# Patient Record
Sex: Female | Born: 1990
Health system: Southern US, Community
[De-identification: ages and names within clinical notes are randomized; demographics above are authoritative.]

## PROBLEM LIST (undated history)

## (undated) DIAGNOSIS — Z789 Other specified health status: Secondary | ICD-10-CM

## (undated) DIAGNOSIS — B009 Herpesviral infection, unspecified: Secondary | ICD-10-CM

## (undated) DIAGNOSIS — F633 Trichotillomania: Secondary | ICD-10-CM

## (undated) HISTORY — PX: OTHER SURGICAL HISTORY: SHX169

## (undated) HISTORY — DX: Trichotillomania: F63.3

## (undated) HISTORY — DX: Herpesviral infection, unspecified: B00.9

## (undated) HISTORY — PX: BREAST ENHANCEMENT SURGERY: SHX7

---
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
## 2010-07-20 ENCOUNTER — Ambulatory Visit (HOSPITAL_COMMUNITY): Payer: Self-pay | Admitting: Psychiatry

## 2010-08-04 ENCOUNTER — Ambulatory Visit (INDEPENDENT_AMBULATORY_CARE_PROVIDER_SITE_OTHER): Payer: 59 | Admitting: Psychiatry

## 2010-08-04 DIAGNOSIS — F919 Conduct disorder, unspecified: Secondary | ICD-10-CM

## 2010-08-04 DIAGNOSIS — F411 Generalized anxiety disorder: Secondary | ICD-10-CM

## 2010-08-12 ENCOUNTER — Encounter (INDEPENDENT_AMBULATORY_CARE_PROVIDER_SITE_OTHER): Payer: 59 | Admitting: Psychiatry

## 2010-08-12 DIAGNOSIS — F919 Conduct disorder, unspecified: Secondary | ICD-10-CM

## 2010-08-12 DIAGNOSIS — F411 Generalized anxiety disorder: Secondary | ICD-10-CM

## 2010-08-26 ENCOUNTER — Encounter (INDEPENDENT_AMBULATORY_CARE_PROVIDER_SITE_OTHER): Payer: 59 | Admitting: Psychiatry

## 2010-08-26 DIAGNOSIS — F411 Generalized anxiety disorder: Secondary | ICD-10-CM

## 2010-08-26 DIAGNOSIS — F919 Conduct disorder, unspecified: Secondary | ICD-10-CM

## 2010-09-01 ENCOUNTER — Ambulatory Visit (INDEPENDENT_AMBULATORY_CARE_PROVIDER_SITE_OTHER): Payer: 59 | Admitting: Psychiatry

## 2010-09-01 DIAGNOSIS — F429 Obsessive-compulsive disorder, unspecified: Secondary | ICD-10-CM

## 2010-09-02 ENCOUNTER — Ambulatory Visit (HOSPITAL_COMMUNITY): Payer: 59 | Admitting: Psychology

## 2010-09-02 NOTE — Progress Notes (Signed)
Jacqueline Mendez, GAME              ACCOUNT NO.:  [id number]  MEDICAL RECORD NO.:  [id number]  LOCATION:  [facility]                           FACILITY:  [facility]  PHYSICIAN:  Brantley Naser T. Donell Tomkins, M.D.   DATE OF BIRTH:  February 21, 1990                                PROGRESS NOTE Date fo Service;09/01/10  The patient is a 20 year old Caucasian employed female who is referred from her primary care doctor, Dr. [name] and our therapist, [name], for seeking treatment.  The patient has been seen by our therapist few times.  She has been diagnosed with anxiety disorder and trichotillomania.  The patient endorsed that she has anxiety symptoms since she was in college.  She endorsed increased anxiety, worrying, incriminating thoughts including pulling her hair from her eyebrow.  She told that lately these symptoms are getting worse and she needed to get some treatment.  In the past she had tried Zoloft, Celexa and Paxil, but last year she stopped the medication, has felt she was doing better but the symptoms restarted again.  She is back on Paxil now 20 mg daily with the Klonopin 0.5 mg at bedtime.  Patient told that Klonopin helps but it makes very dizzy and sleepy and she only takes if she needed.  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  The patient is worried about her future, career,  life goal and wants to be accomplished in her life. The patient admitted that she has sometimes rumination and rituals in her head when she worried about leaving electrical items unplugged.  She also complained of fear with a height and obsessed about things need to be in order.  Patient told that her boyfriend believes that I am very anxious and nervous person.  She admitted sleeping too much when she takes Klonopin, but she denies any agitation, anger, mood swings or paranoid thinking.  She reported hearing songs when she is under stress and these  songs are sometimes comforting when she is in a good mood and disturbing when she is in a bad mood.  She denies any auditory hallucinations or visual hallucinations.  She reported that Paxil and Klonopin had helped her but she is more anxious about taking exam which she failed last time.  She denies any suicidal thoughts or homicidal thoughts at this time.  PAST PSYCHIATRIC HISTORY: As mentioned above, the patient has been tried with Celexa and Zoloft in the past.  She denies any previous history of inpatient treatment or previous history of suicidal attempt.  There has been no history of violence or anger or  agitation.  FAMILY HISTORY: The patient admitted that one of her cousins has psychiatric illness and her dad has depression who takes medication.  MEDICAL ILLNESS: The patient has seasonal allergies.  She also takes birth control pill.  PSYCHOSOCIAL HISTORY: The patient is born and raised in [place] area.  She has a loving family.  Her fiance is very supportive.  She is working as a Lawyer and likes her job.  EDUCATION/HISTORY: The patient has high school education and currently enrolled in [employer] for nursing  school.  ALCOHOL AND SUBSTANCE ABUSE HISTORY: The patient denies any history of any illegal substance.  She admitted drinking occasionally wine, but no history of tremors, blackouts, withdrawals or intoxication.  CURRENT MEDICATIONS: 1. Paxil 20 mg daily. 2. Klonopin 0.5 mg p.r.n. as needed. 3. Birth control pills.  MENTAL STATUS EXAM: The patient is a young female who is well-groomed, well-dressed, appears appropriate with her age.  She is cooperative, pleasant and maintaining good eye contact.  Her speech is relevant, soft and coherent.  Her thought process was logical, linear and goal-directed.  She described her mood as anxious and affect was mood congruent.  She denies any auditory hallucinations, suicidal thoughts or homicidal thoughts.  There was  no  psychosis present.  She is alert and oriented x3.  Her attention and concentration were good.  Her insight, judgment, impulse control were okay.  DIAGNOSIS: AXIS I:  Anxiety disorder NOS, rule out obsessive disorder, rule out obsessive-compulsive disorder.  Trichotillomania. AXIS II:  Deferred. AXIS III:  Seasonal allergies. AXIS IV:  Mild to moderate. AXIS V:  65-75.  PLAN: At this time I do believe the patient is doing better on Paxil 20 mg though she has complained some increased sleep with the Klonopin.  I recommended to take only half as needed.  Patient experiencing no side effects of medication at this time, however, she is very worried about her illness course and prognosis.  We spent time about the risks and benefits of medication along with the prognosis and comorbidity of her psychiatric illness.  We also talked about getting psychological testing to further strengthen the diagnosis.  Patient has agreed to that.  We will schedule appointment with Dr. Kieth Brightly for psychological testing. She will continue to work with [name] to increase her coping and social skills and to reduce her anxiety and symptoms without drug therapy.  We will consider changing the antidepressant if the patient does not feel improvement or starting to get worse on her current medication.  I will see her again in 5 weeks.     [name] T. Lolly Mustache, M.D.     STA/MEDQ  D:  09/01/2010  T:  09/01/2010  Job:  [id number]  Electronically Signed by Kathryne Sharper M.D. on 09/02/2010 01:16:39 PM

## 2010-09-03 ENCOUNTER — Encounter (HOSPITAL_COMMUNITY): Payer: 59 | Admitting: Psychiatry

## 2010-09-08 ENCOUNTER — Encounter (HOSPITAL_COMMUNITY): Payer: 59 | Admitting: Psychiatry

## 2010-09-15 ENCOUNTER — Ambulatory Visit (HOSPITAL_COMMUNITY): Payer: 59 | Admitting: Psychiatry

## 2010-10-06 ENCOUNTER — Encounter (HOSPITAL_COMMUNITY): Payer: 59 | Admitting: Psychiatry

## 2010-10-15 LAB — RAPID STREP SCREEN (MED CTR MEBANE ONLY): Streptococcus, Group A Screen (Direct): NEGATIVE

## 2010-10-15 LAB — STREP A DNA PROBE

## 2011-01-09 ENCOUNTER — Other Ambulatory Visit (HOSPITAL_COMMUNITY): Payer: Self-pay | Admitting: Psychiatry

## 2012-08-30 ENCOUNTER — Encounter: Payer: Self-pay | Admitting: *Deleted

## 2012-09-06 ENCOUNTER — Ambulatory Visit (INDEPENDENT_AMBULATORY_CARE_PROVIDER_SITE_OTHER): Payer: 59 | Admitting: Family Medicine

## 2012-09-06 ENCOUNTER — Encounter: Payer: Self-pay | Admitting: Family Medicine

## 2012-09-06 ENCOUNTER — Telehealth: Payer: Self-pay | Admitting: Family Medicine

## 2012-09-06 VITALS — BP 110/68 | Ht 64.0 in | Wt 139.2 lb

## 2012-09-06 DIAGNOSIS — Z0000 Encounter for general adult medical examination without abnormal findings: Secondary | ICD-10-CM

## 2012-09-06 DIAGNOSIS — F429 Obsessive-compulsive disorder, unspecified: Secondary | ICD-10-CM

## 2012-09-06 DIAGNOSIS — M549 Dorsalgia, unspecified: Secondary | ICD-10-CM

## 2012-09-06 NOTE — Progress Notes (Signed)
  Subjective:    Patient ID: Ricka Burdock, female    DOB: 12/07/1990, 22 y.o.   MRN: [id number]  HPI  Reg exercise, goes daily to a gym  Watching her diet closely  No new med problems Some recent low bk pain with exercise and exertion, mild  To mod ache no noct sympt   Review of Systems No chest pain no back pain other than noted no abdominal pain no change in bowel habits ROS otherwise negative    Objective:   Physical Exam  Alert no acute distress. HEENT normal. Lungs clear. Heart regular rate and rhythm. Low back slight discomfort to palpation      Assessment & Plan:  Impression 1 lumbar strain. #2 vaccines clarified for school purposes form filled out. Plan symptomatic care discussed.

## 2012-09-06 NOTE — Telephone Encounter (Signed)
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

## 2012-09-07 DIAGNOSIS — F429 Obsessive-compulsive disorder, unspecified: Secondary | ICD-10-CM | POA: Insufficient documentation

## 2012-09-08 LAB — TB SKIN TEST: TB Skin Test: NEGATIVE

## 2012-11-13 ENCOUNTER — Encounter: Payer: Self-pay | Admitting: Nurse Practitioner

## 2012-11-13 ENCOUNTER — Ambulatory Visit (INDEPENDENT_AMBULATORY_CARE_PROVIDER_SITE_OTHER): Payer: 59 | Admitting: Nurse Practitioner

## 2012-11-13 VITALS — BP 110/64 | Temp 99.8°F | Ht 64.0 in | Wt 145.0 lb

## 2012-11-13 DIAGNOSIS — Z789 Other specified health status: Secondary | ICD-10-CM

## 2012-11-13 DIAGNOSIS — N644 Mastodynia: Secondary | ICD-10-CM

## 2012-11-13 DIAGNOSIS — F419 Anxiety disorder, unspecified: Secondary | ICD-10-CM

## 2012-11-13 MED ORDER — NAPROXEN 500 MG PO TABS: 500.0000 mg | ORAL_TABLET | Freq: Two times a day (BID) | ORAL | Status: DC

## 2012-11-13 MED ORDER — CEPHALEXIN 500 MG PO CAPS: 500.0000 mg | ORAL_CAPSULE | Freq: Three times a day (TID) | ORAL | Status: DC

## 2012-11-13 MED ORDER — ALPRAZOLAM 0.5 MG PO TABS: ORAL_TABLET | ORAL | Status: DC

## 2012-11-14 ENCOUNTER — Telehealth: Payer: Self-pay | Admitting: *Deleted

## 2012-11-14 NOTE — Telephone Encounter (Signed)
Area of tenderness is at areola and mainly in the breast 6-7 o'clock just beneath areola.

## 2012-11-14 NOTE — Telephone Encounter (Signed)
[facility] needs to know exact location of pain and swelling in right breast before they will schedule ultrasound. Scheduler stated they do not scan the whole breast only the area with the issue. [facility] # is [id number]. Pt can go any time after 9am on thurs. Order is already put in system. Need to call and schedule after we have exact area on breast.

## 2012-11-15 ENCOUNTER — Encounter: Payer: Self-pay | Admitting: Nurse Practitioner

## 2012-11-15 DIAGNOSIS — N644 Mastodynia: Secondary | ICD-10-CM | POA: Insufficient documentation

## 2012-11-15 DIAGNOSIS — Z789 Other specified health status: Secondary | ICD-10-CM | POA: Insufficient documentation

## 2012-11-15 DIAGNOSIS — F633 Trichotillomania: Secondary | ICD-10-CM | POA: Insufficient documentation

## 2012-11-15 DIAGNOSIS — F419 Anxiety disorder, unspecified: Secondary | ICD-10-CM | POA: Insufficient documentation

## 2012-11-15 NOTE — Assessment & Plan Note (Signed)
Given prescription for Xanax 0.5 mg to use only for clinicals at school.

## 2012-11-15 NOTE — Telephone Encounter (Signed)
Notified mom that ultrasound was scheduled for Nov. 13, 2014 at 10:30 am at [facility]. Mom verbalized understanding and will notify the patient.

## 2012-11-15 NOTE — Progress Notes (Signed)
Subjective:  Presents complaints of localized pain in the right breast area for the past week and a half. Describes a fairly constant throbbing pain with occasional sharp pain. Cannot tolerate need pressure to the right breast. No drainage from the nipple. No fevers. Has Nexplanon for birth control. No headaches. Has implants in both breast, was done by Dr. Delia Chimes. Has not had any problems before now. Mentions at the end of the visit that she has been having extreme situational anxiety at school. Is in a Armed forces operational officer program. Mainly gets nervous when she is being evaluated her instructor. Otherwise anxiety has been manageable. Took a low dose Xanax given to her by a friend, this greatly decreased her anxiety.  Objective:   BP 110/64  Temp(Src) 99.8 F (37.7 C) (Oral)  Ht 5\' 4"  (1.626 m)  Wt 145 lb (65.772 kg)  BMI 24.88 kg/m2 NAD. Alert, oriented. Mildly anxious affect. Lungs clear. Heart regular rate rhythm. Right breast slightly enlarged as compared to left. No erythema warmth. Distinct tenderness around the areola and just beneath this around 6 to 7:00. Extremely tender. No obvious masses but exam limited due to implant. Temp 99.8.  Assessment:Breast pain - Plan: US Breast Right  Anxiety-related barrier to education  Plan:  Meds ordered this encounter  Medications  . naproxen (NAPROSYN) 500 MG tablet    Sig: Take 1 tablet (500 mg total) by mouth 2 (two) times daily with a meal. Prn pain    Dispense:  30 tablet    Refill:  0    Order Specific Question:  Supervising Provider    Answer:  Merlyn Albert [[id number]]  . ALPRAZolam (XANAX) 0.5 MG tablet    Sig: 1/2 -1 po qd prn anxiety    Dispense:  30 tablet    Refill:  0    Order Specific Question:  Supervising Provider    Answer:  Merlyn Albert [[id number]]  . cephALEXin (KEFLEX) 500 MG capsule    Sig: Take 1 capsule (500 mg total) by mouth 3 (three) times daily.    Dispense:  21 capsule    Refill:  0    Order Specific  Question:  Supervising Provider    Answer:  Merlyn Albert [[id number]]   Ultrasound ordered. As precaution started on Keflex due to fever. Warning signs reviewed. Will refer for recheck of her right breast. Patient to call back to our office sooner if symptoms worsen. Also limited use of Xanax only for clinical.

## 2012-11-15 NOTE — Assessment & Plan Note (Signed)
.   naproxen (NAPROSYN) 500 MG tablet    Sig: Take 1 tablet (500 mg total) by mouth 2 (two) times daily with a meal. Prn pain    Dispense:  30 tablet    Refill:  0    Order Specific Question:  Supervising Provider    Answer:  Merlyn Albert [[id number]]  . ALPRAZolam (XANAX) 0.5 MG tablet    Sig: 1/2 -1 po qd prn anxiety    Dispense:  30 tablet    Refill:  0    Order Specific Question:  Supervising Provider    Answer:  Merlyn Albert [[id number]]  . cephALEXin (KEFLEX) 500 MG capsule    Sig: Take 1 capsule (500 mg total) by mouth 3 (three) times daily.    Dispense:  21 capsule    Refill:  0    Order Specific Question:  Supervising Provider    Answer:  Merlyn Albert [[id number]]   Ultrasound ordered. As precaution started on Keflex due to fever. Warning signs reviewed. Will refer for recheck of her right breast. Patient to call back to our office sooner if symptoms worsen

## 2012-11-23 ENCOUNTER — Telehealth: Payer: Self-pay | Admitting: Family Medicine

## 2012-11-23 NOTE — Telephone Encounter (Signed)
Pt needs her ultrasound scheduled sooner than the 13th of Nov, can we call to see if that is possible. Pt was there and told her appt is at a later date, they told her to call our office to change the date. She will also need instructions on where to go for this appt as well.

## 2012-11-23 NOTE — Telephone Encounter (Signed)
Patient stated she called them to reschedule and they informed her they were completely booked and scheduling out 2 weeks. Patient to keep her current appt.

## 2012-12-07 ENCOUNTER — Ambulatory Visit: Payer: Self-pay | Admitting: Nurse Practitioner

## 2013-01-01 ENCOUNTER — Encounter: Payer: Self-pay | Admitting: Family Medicine

## 2013-02-15 ENCOUNTER — Telehealth: Payer: Self-pay | Admitting: Family Medicine

## 2013-02-15 MED ORDER — ALPRAZOLAM 0.5 MG PO TABS: ORAL_TABLET | ORAL | Status: DC

## 2013-02-15 NOTE — Telephone Encounter (Signed)
Patient needs Rx for xanax to [facility]

## 2013-02-15 NOTE — Telephone Encounter (Signed)
Ok times one no ref 

## 2013-02-15 NOTE — Telephone Encounter (Signed)
Last office visit 10/2012

## 2013-02-15 NOTE — Telephone Encounter (Signed)
Script faxed to pharmacy. Left message on voicemail notifying patient.  

## 2013-03-01 ENCOUNTER — Encounter: Payer: Self-pay | Admitting: Family Medicine

## 2013-03-01 ENCOUNTER — Encounter: Payer: Self-pay | Admitting: Nurse Practitioner

## 2013-03-01 ENCOUNTER — Ambulatory Visit (INDEPENDENT_AMBULATORY_CARE_PROVIDER_SITE_OTHER): Payer: 59 | Admitting: Nurse Practitioner

## 2013-03-01 VITALS — BP 110/72 | Ht 64.0 in | Wt 153.2 lb

## 2013-03-01 DIAGNOSIS — Z79899 Other long term (current) drug therapy: Secondary | ICD-10-CM

## 2013-03-01 DIAGNOSIS — Z789 Other specified health status: Secondary | ICD-10-CM

## 2013-03-01 DIAGNOSIS — F419 Anxiety disorder, unspecified: Secondary | ICD-10-CM

## 2013-03-01 DIAGNOSIS — F988 Other specified behavioral and emotional disorders with onset usually occurring in childhood and adolescence: Secondary | ICD-10-CM

## 2013-03-01 MED ORDER — AMPHETAMINE-DEXTROAMPHET ER 20 MG PO CP24: 20.0000 mg | ORAL_CAPSULE | Freq: Every day | ORAL | Status: DC

## 2013-03-05 ENCOUNTER — Encounter: Payer: Self-pay | Admitting: Nurse Practitioner

## 2013-03-05 DIAGNOSIS — F988 Other specified behavioral and emotional disorders with onset usually occurring in childhood and adolescence: Secondary | ICD-10-CM | POA: Insufficient documentation

## 2013-03-05 NOTE — Progress Notes (Signed)
Subjective:  Presents for complaints of increased difficulty focusing especially while in school. Easily distracted. States she's had problems with this most of her school career. Seems to be getting worse. Easily distracted even at home when she's trying to do homework. Currently in the dental hygienist program, has been under a lot of stress. Slight increase in her Xanax use, using it every other day at this point. Also has noticed reverting back to trichotillomania which has been worse with school. Denies suicidal/homicidal thoughts or ideation.  Objective:   BP 110/72  Ht 5\' 4"  (1.626 m)  Wt 153 lb 3.2 oz (69.491 kg)  BMI 26.28 kg/m2 NAD. Alert, oriented. Mildly anxious affect. Lungs clear. Heart regular rate rhythm. EKG normal limit.  Assessment: Problem List Items Addressed This Visit     Other   Anxiety-related barrier to education   ADD (attention deficit disorder) - Primary    Other Visit Diagnoses   High risk medication use        Relevant Orders       PR ELECTROCARDIOGRAM, COMPLETE      Meds ordered this encounter  Medications  . valACYclovir (VALTREX) 1000 MG tablet    Sig: Take 1,000 mg by mouth. As needed  . amphetamine-dextroamphetamine (ADDERALL XR) 20 MG 24 hr capsule    Sig: Take 1 capsule (20 mg total) by mouth daily.    Dispense:  30 capsule    Refill:  0    Order Specific Question:  Supervising Provider    Answer:  Riccardo DubinLUKING, WILLIAM S [[id number]]   Will do a trial of Adderall to see if this will help with her concentration. Explained to patient that some of her difficulty focusing may also be due to anxiety. This may need to be addressed at her next visit in one month. Call back sooner if any problems.

## 2013-03-21 ENCOUNTER — Encounter: Payer: Self-pay | Admitting: Nurse Practitioner

## 2013-04-05 ENCOUNTER — Encounter: Payer: Self-pay | Admitting: Nurse Practitioner

## 2013-04-05 ENCOUNTER — Ambulatory Visit (INDEPENDENT_AMBULATORY_CARE_PROVIDER_SITE_OTHER): Payer: 59 | Admitting: Nurse Practitioner

## 2013-04-05 VITALS — BP 124/70 | Ht 64.0 in | Wt 147.0 lb

## 2013-04-05 DIAGNOSIS — F419 Anxiety disorder, unspecified: Secondary | ICD-10-CM

## 2013-04-05 DIAGNOSIS — N949 Unspecified condition associated with female genital organs and menstrual cycle: Secondary | ICD-10-CM

## 2013-04-05 DIAGNOSIS — N938 Other specified abnormal uterine and vaginal bleeding: Secondary | ICD-10-CM

## 2013-04-05 DIAGNOSIS — Z789 Other specified health status: Secondary | ICD-10-CM

## 2013-04-05 DIAGNOSIS — F988 Other specified behavioral and emotional disorders with onset usually occurring in childhood and adolescence: Secondary | ICD-10-CM

## 2013-04-05 MED ORDER — AMPHETAMINE-DEXTROAMPHET ER 20 MG PO CP24: 20.0000 mg | ORAL_CAPSULE | Freq: Every day | ORAL | Status: DC

## 2013-04-05 MED ORDER — ALPRAZOLAM 0.5 MG PO TABS: ORAL_TABLET | ORAL | Status: DC

## 2013-04-05 MED ORDER — MEGESTROL ACETATE 40 MG PO TABS: ORAL_TABLET | ORAL | Status: DC

## 2013-04-09 ENCOUNTER — Encounter: Payer: Self-pay | Admitting: Nurse Practitioner

## 2013-04-09 NOTE — Progress Notes (Signed)
Subjective:  Presents for routine followup. Doing well in school. Adderall is controlling her ADHD symptoms. Uses Xanax occasionally during the week only on clinical days. Does not take this every day. Has Nexplanon for birth control, since 2013. Has had some breakthrough bleeding off-and-on for the past month, requesting medication to help stop bleeding. No new sexual partners. No vaginal discharge. No pelvic pain.  Objective:   BP 124/70  Ht 5\' 4"  (1.626 m)  Wt 147 lb (66.679 kg)  BMI 25.22 kg/m2 NAD. Alert, oriented. Mildly anxious affect. Lungs clear. Heart regular rate rhythm.  Assessment: Problem List Items Addressed This Visit     Other   Anxiety-related barrier to education   Relevant Medications      ALPRAZolam (XANAX) tablet   ADD (attention deficit disorder) - Primary    Other Visit Diagnoses   DUB (dysfunctional uterine bleeding)          Plan: Meds ordered this encounter  Medications  . DISCONTD: amphetamine-dextroamphetamine (ADDERALL XR) 20 MG 24 hr capsule    Sig: Take 1 capsule (20 mg total) by mouth daily.    Dispense:  30 capsule    Refill:  0    Order Specific Question:  Supervising Provider    Answer:  Merlyn AlbertLUKING, WILLIAM S [[id number]]  . ALPRAZolam (XANAX) 0.5 MG tablet    Sig: 1/2 -1 po qd prn anxiety    Dispense:  30 tablet    Refill:  0    Order Specific Question:  Supervising Provider    Answer:  Merlyn AlbertLUKING, WILLIAM S [[id number]]  . DISCONTD: amphetamine-dextroamphetamine (ADDERALL XR) 20 MG 24 hr capsule    Sig: Take 1 capsule (20 mg total) by mouth daily.    Dispense:  30 capsule    Refill:  0    May refill 30 days from 04/05/13    Order Specific Question:  Supervising Provider    Answer:  Merlyn AlbertLUKING, WILLIAM S [[id number]]  . amphetamine-dextroamphetamine (ADDERALL XR) 20 MG 24 hr capsule    Sig: Take 1 capsule (20 mg total) by mouth daily.    Dispense:  30 capsule    Refill:  0    May refill 60 days from 04/05/13    Order Specific Question:  Supervising Provider   Answer:  Merlyn AlbertLUKING, WILLIAM S [[id number]]  . megestrol (MEGACE) 40 MG tablet    Sig: 3 po qd x 5 d then 2 po qd x 5 d then 1 po qd x 5d    Dispense:  30 tablet    Refill:  0    Order Specific Question:  Supervising Provider    Answer:  Merlyn AlbertLUKING, WILLIAM S [[id number]]   Continue current meds as directed. Trial of Megace to see if this will stop bleeding, call back if persists. Recheck in 3 months, sooner if any problems.

## 2013-05-17 ENCOUNTER — Encounter: Payer: Self-pay | Admitting: Nurse Practitioner

## 2013-05-17 ENCOUNTER — Ambulatory Visit (INDEPENDENT_AMBULATORY_CARE_PROVIDER_SITE_OTHER): Payer: 59 | Admitting: Nurse Practitioner

## 2013-05-17 VITALS — BP 120/80 | Ht 64.0 in | Wt 142.0 lb

## 2013-05-17 DIAGNOSIS — F419 Anxiety disorder, unspecified: Secondary | ICD-10-CM

## 2013-05-17 DIAGNOSIS — Z789 Other specified health status: Secondary | ICD-10-CM

## 2013-05-17 DIAGNOSIS — F988 Other specified behavioral and emotional disorders with onset usually occurring in childhood and adolescence: Secondary | ICD-10-CM

## 2013-05-17 MED ORDER — AMPHETAMINE-DEXTROAMPHET ER 20 MG PO CP24: 20.0000 mg | ORAL_CAPSULE | Freq: Every day | ORAL | Status: DC

## 2013-05-17 MED ORDER — ALPRAZOLAM 0.5 MG PO TABS: ORAL_TABLET | ORAL | Status: DC

## 2013-05-20 ENCOUNTER — Encounter: Payer: Self-pay | Admitting: Nurse Practitioner

## 2013-05-20 NOTE — Progress Notes (Signed)
Subjective:  Presents for recheck on her ADD. Adderall is working well. Has been under more stress lately with school. About once a week she will have some mild palpitations and feel jittery. Usually when she is excited or under stress. No associated chest pain shortness of breath or syncope. Can feel jittery up to 30-60 minutes. Denies any caffeine intake. No OTC supplements or meds. At times her stress is 10 out of 10. Uses Xanax only for extreme anxiety, about 3 times per week. Does not have time for counseling or other intervention, goes to school and has a job.  Objective:   BP 120/80  Ht 5\' 4"  (1.626 m)  Wt 142 lb (64.411 kg)  BMI 24.36 kg/m2 NAD. Alert, oriented. Mildly anxious affect. Lungs clear. Heart regular rate rhythm. No tachycardia noted.  Assessment: Problem List Items Addressed This Visit     Other   Anxiety-related barrier to education   Relevant Medications      ALPRAZolam (XANAX) tablet   ADD (attention deficit disorder) - Primary     palpitations/mild panic attack secondary to situational stress  Plan: Given 3 separate monthly prescriptions for her Adderall. Refill of Xanax, continue to use sparingly. Patient defers daily medication at this point. To call back if further intervention is needed. Otherwise followup in 3 months.

## 2013-07-06 ENCOUNTER — Encounter: Payer: Self-pay | Admitting: Nurse Practitioner

## 2013-07-09 ENCOUNTER — Other Ambulatory Visit: Payer: Self-pay | Admitting: Nurse Practitioner

## 2013-07-09 MED ORDER — MEGESTROL ACETATE 40 MG PO TABS: ORAL_TABLET | ORAL | Status: DC

## 2013-07-16 ENCOUNTER — Other Ambulatory Visit: Payer: Self-pay | Admitting: Nurse Practitioner

## 2013-07-16 ENCOUNTER — Other Ambulatory Visit: Payer: Self-pay | Admitting: *Deleted

## 2013-07-16 MED ORDER — ALPRAZOLAM 0.5 MG PO TABS: ORAL_TABLET | ORAL | Status: DC

## 2013-09-07 ENCOUNTER — Ambulatory Visit (INDEPENDENT_AMBULATORY_CARE_PROVIDER_SITE_OTHER): Payer: 59 | Admitting: Nurse Practitioner

## 2013-09-07 ENCOUNTER — Encounter: Payer: Self-pay | Admitting: Nurse Practitioner

## 2013-09-07 VITALS — BP 104/70 | Ht 64.0 in | Wt 145.4 lb

## 2013-09-07 DIAGNOSIS — F419 Anxiety disorder, unspecified: Secondary | ICD-10-CM

## 2013-09-07 DIAGNOSIS — Z789 Other specified health status: Secondary | ICD-10-CM

## 2013-09-07 DIAGNOSIS — F988 Other specified behavioral and emotional disorders with onset usually occurring in childhood and adolescence: Secondary | ICD-10-CM

## 2013-09-07 MED ORDER — AMPHETAMINE-DEXTROAMPHET ER 20 MG PO CP24: 20.0000 mg | ORAL_CAPSULE | Freq: Every day | ORAL | Status: DC

## 2013-09-07 MED ORDER — AMPHETAMINE-DEXTROAMPHETAMINE 10 MG PO TABS: ORAL_TABLET | ORAL | Status: DC

## 2013-09-07 MED ORDER — ALPRAZOLAM 0.5 MG PO TABS: ORAL_TABLET | ORAL | Status: DC

## 2013-09-11 ENCOUNTER — Encounter: Payer: Self-pay | Admitting: Nurse Practitioner

## 2013-09-11 NOTE — Progress Notes (Signed)
Subjective:  Presents for recheck of ADD. Will be starting her last year of school. Stopped her meds this summer. Adderall XR 20 mg wears off about 4 pm. Needs med in the evening to help with studying. Also uses rare Xanax for situational stress during clinical. No problems sleeping while on Adderall. Anxiety has been stable overall.  Objective:   BP 104/70  Ht 5\' 4"  (1.626 m)  Wt 145 lb 6 oz (65.942 kg)  BMI 24.94 kg/m2 NAD. Alert, oriented. Lungs clear. Heart RRR.  Assessment:  Problem List Items Addressed This Visit     Other   Anxiety-related barrier to education   Relevant Medications      ALPRAZolam Prudy Feeler(XANAX) tablet   ADD (attention deficit disorder) - Primary     Plan:  Meds ordered this encounter  Medications  . ALPRAZolam (XANAX) 0.5 MG tablet    Sig: 1/2 -1 po qd prn anxiety    Dispense:  30 tablet    Refill:  0    Order Specific Question:  Supervising Provider    Answer:  Merlyn AlbertLUKING, WILLIAM S [[id number]]  . DISCONTD: amphetamine-dextroamphetamine (ADDERALL XR) 20 MG 24 hr capsule    Sig: Take 1 capsule (20 mg total) by mouth daily.    Dispense:  30 capsule    Refill:  0    Order Specific Question:  Supervising Provider    Answer:  Merlyn AlbertLUKING, WILLIAM S [[id number]]  . DISCONTD: amphetamine-dextroamphetamine (ADDERALL) 10 MG tablet    Sig: One po in the afternoon    Dispense:  30 tablet    Refill:  0    Order Specific Question:  Supervising Provider    Answer:  Merlyn AlbertLUKING, WILLIAM S [[id number]]  . DISCONTD: amphetamine-dextroamphetamine (ADDERALL XR) 20 MG 24 hr capsule    Sig: Take 1 capsule (20 mg total) by mouth daily.    Dispense:  30 capsule    Refill:  0    May fill 30 days from 09/07/13    Order Specific Question:  Supervising Provider    Answer:  Merlyn AlbertLUKING, WILLIAM S [[id number]]  . DISCONTD: amphetamine-dextroamphetamine (ADDERALL) 10 MG tablet    Sig: One po in the afternoon    Dispense:  30 tablet    Refill:  0    May fill 30 days from 09/07/13    Order Specific Question:   Supervising Provider    Answer:  Merlyn AlbertLUKING, WILLIAM S [[id number]]  . amphetamine-dextroamphetamine (ADDERALL XR) 20 MG 24 hr capsule    Sig: Take 1 capsule (20 mg total) by mouth daily.    Dispense:  30 capsule    Refill:  0    May fill 60 days from 09/07/13    Order Specific Question:  Supervising Provider    Answer:  Merlyn AlbertLUKING, WILLIAM S [[id number]]  . amphetamine-dextroamphetamine (ADDERALL) 10 MG tablet    Sig: One po in the afternoon    Dispense:  30 tablet    Refill:  0    May fill 60 days from 09/07/13    Order Specific Question:  Supervising Provider    Answer:  Merlyn AlbertLUKING, WILLIAM S [[id number]]   Add Adderall 10 mg in the afternoon when needed.  Call back if any problems. Return in about 3 months (around 12/08/2013).

## 2013-09-27 ENCOUNTER — Encounter: Payer: Self-pay | Admitting: Nurse Practitioner

## 2013-09-27 ENCOUNTER — Ambulatory Visit (INDEPENDENT_AMBULATORY_CARE_PROVIDER_SITE_OTHER): Payer: 59 | Admitting: Nurse Practitioner

## 2013-09-27 VITALS — BP 110/78 | Ht 64.0 in | Wt 142.0 lb

## 2013-09-27 DIAGNOSIS — Z01419 Encounter for gynecological examination (general) (routine) without abnormal findings: Secondary | ICD-10-CM

## 2013-09-27 DIAGNOSIS — Z79899 Other long term (current) drug therapy: Secondary | ICD-10-CM

## 2013-09-27 DIAGNOSIS — Z0000 Encounter for general adult medical examination without abnormal findings: Secondary | ICD-10-CM

## 2013-09-27 DIAGNOSIS — R5383 Other fatigue: Secondary | ICD-10-CM

## 2013-09-27 DIAGNOSIS — Z113 Encounter for screening for infections with a predominantly sexual mode of transmission: Secondary | ICD-10-CM

## 2013-09-27 DIAGNOSIS — R5381 Other malaise: Secondary | ICD-10-CM

## 2013-09-27 MED ORDER — METRONIDAZOLE 500 MG PO TABS: 500.0000 mg | ORAL_TABLET | Freq: Two times a day (BID) | ORAL | Status: DC

## 2013-09-27 MED ORDER — VALACYCLOVIR HCL 1 G PO TABS: ORAL_TABLET | ORAL | Status: DC

## 2013-09-28 ENCOUNTER — Encounter: Payer: Self-pay | Admitting: Nurse Practitioner

## 2013-09-28 LAB — GC/CHLAMYDIA PROBE AMP, URINE
Chlamydia, Swab/Urine, PCR: NEGATIVE
GC Probe Amp, Urine: NEGATIVE

## 2013-09-28 NOTE — Progress Notes (Signed)
Subjective:    Patient ID: Jacqueline Mendez, female    DOB: 1990-12-18, 23 y.o.   MRN: [id number]  HPI presents for her wellness physical. Same sexual partner for the past 2 years. Was with this partner with last PAP and STD screening. occas light bleeding. Has Nexplanon. Decreased exercise due to work and school. Regular vision and dental exams. Has some slight vaginal odor before cycle. No color. No pelvic pain or fever.     Review of Systems  Constitutional: Positive for activity change and fatigue. Negative for fever and appetite change.  HENT: Negative for congestion, dental problem, ear pain, sinus pressure and sore throat.   Respiratory: Negative for cough, chest tightness, shortness of breath and wheezing.   Cardiovascular: Negative for chest pain.  Gastrointestinal: Negative for nausea, vomiting, abdominal pain, diarrhea, constipation, blood in stool and abdominal distention.  Genitourinary: Negative for dysuria, urgency, frequency, vaginal discharge, enuresis, difficulty urinating, genital sores, menstrual problem and pelvic pain.       C/o vaginal odor.       Objective:   Physical Exam  Vitals reviewed. Constitutional: She is oriented to person, place, and time. She appears well-developed. No distress.  HENT:  Right Ear: External ear normal.  Left Ear: External ear normal.  Mouth/Throat: Oropharynx is clear and moist.  Neck: Normal range of motion. Neck supple. No tracheal deviation present. No thyromegaly present.  Cardiovascular: Normal rate, regular rhythm and normal heart sounds.  Exam reveals no gallop.   No murmur heard. Pulmonary/Chest: Effort normal and breath sounds normal.  Abdominal: Soft. She exhibits no distension. There is no tenderness.  Genitourinary: Vagina normal and uterus normal. No vaginal discharge found.  External GU: normal. Vagina: moderate amount of bleeding noted. Unable to obtain wet prep. Bimanual exam normal.   Musculoskeletal: She exhibits  no edema.  Lymphadenopathy:    She has no cervical adenopathy.  Neurological: She is alert and oriented to person, place, and time.  Skin: Skin is warm and dry. No rash noted.  Psychiatric: She has a normal mood and affect. Her behavior is normal.  breast exam: implants bilat; no masses around the periphery; axillae no adenopathy.        Assessment & Plan:  Well woman exam - Plan: Lipid panel  Other malaise and fatigue - Plan: CBC with Differential, TSH, Vit D  25 hydroxy (rtn osteoporosis monitoring)  Screening for STD (sexually transmitted disease) - Plan: HIV antibody, RPR, Hepatitis C Antibody, GC/chlamydia probe amp, urine, CANCELED: GC/chlamydia probe amp, urine  Encounter for long-term (current) use of other medications - Plan: Hepatic function panel, Basic metabolic panel  Encouraged healthy diet and regular activity. Daily vitamin D and calcium. Discussed safe sex issues. Next physical in one year. Meds ordered this encounter  Medications  . metroNIDAZOLE (FLAGYL) 500 MG tablet    Sig: Take 1 tablet (500 mg total) by mouth 2 (two) times daily with a meal.    Dispense:  14 tablet    Refill:  0    Order Specific Question:  Supervising Provider    Answer:  Merlyn Albert [[id number]]  . valACYclovir (VALTREX) 1000 MG tablet    Sig: One po BID x 5 d then one po qd    Dispense:  30 tablet    Refill:  0    Order Specific Question:  Supervising Provider    Answer:  Merlyn Albert [[id number]]   Call back if vaginal symptoms persist. Otherwise routine followup for  ADHD.

## 2013-09-30 ENCOUNTER — Encounter: Payer: Self-pay | Admitting: Nurse Practitioner

## 2013-11-26 ENCOUNTER — Other Ambulatory Visit: Payer: Self-pay

## 2013-11-26 NOTE — Telephone Encounter (Signed)
Last seen 09/27/13

## 2013-11-27 MED ORDER — ALPRAZOLAM 0.5 MG PO TABS: ORAL_TABLET | ORAL | Status: DC

## 2013-11-27 NOTE — Telephone Encounter (Signed)
Ok times one 

## 2014-01-10 IMAGING — US ULTRASOUND RIGHT BREAST
1 series · 1 of 1 positions shown · non-contrast
Comparison: none

CLINICAL DATA: Patient has bilateral implants and is experiencing
pain in the 6 o'clock position of the right breast for about 1 month

EXAM:
ULTRASOUND RIGHT BREAST

[Series 1: ultrasound right breast · 0.08mm/px · 1 of 1 slices shown]
[im 1/1]
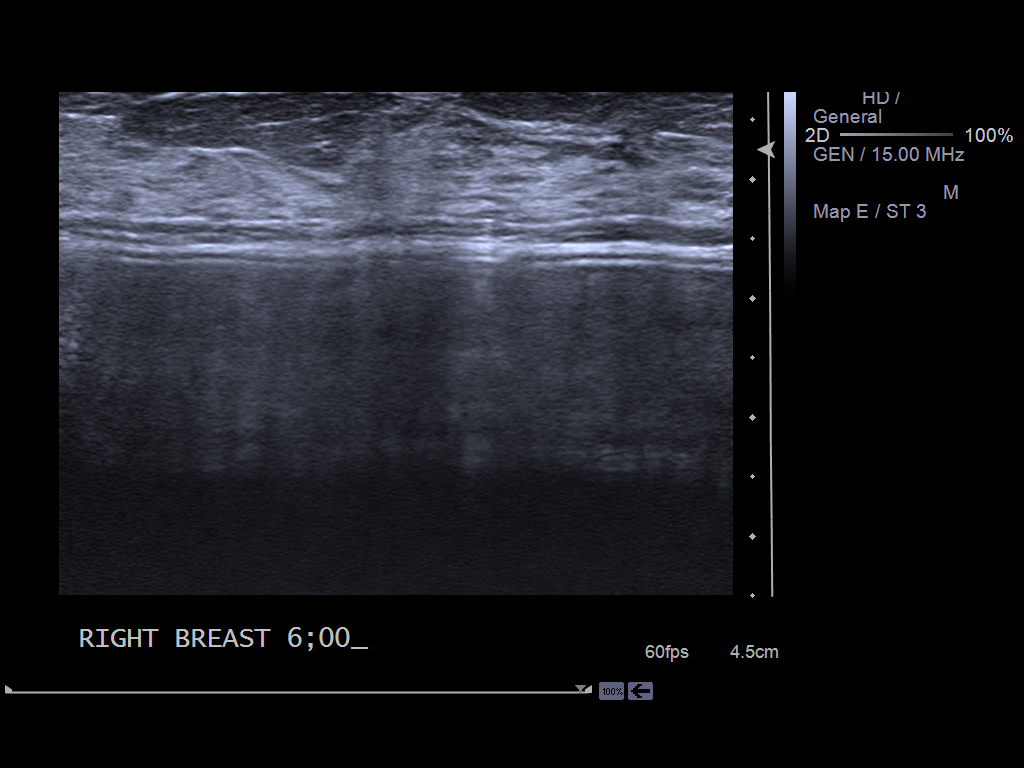

[1 of 1 positions shown; findings below may reference images not displayed]

FINDINGS: On physical exam,there are no palpable abnormalities.

Ultrasound is performed, showing no abnormalities in the 4 o'clock
through 7 o'clock position of the right breast. There is dense
fibroglandular tissue. Underlying implant appears intact by
ultrasound..
IMPRESSION: No abnormalities.

RECOMMENDATION:
Screening mammogram age 39. I discussed with the patient the various
causes and potential treatment for breast pain.

I have discussed the findings and recommendations with the patient.
Results were also provided in writing at the conclusion of the
visit. If applicable, a reminder letter will be sent to the patient
regarding the next appointment.

BI-RADS CATEGORY  1: Negative

## 2014-02-01 ENCOUNTER — Ambulatory Visit (INDEPENDENT_AMBULATORY_CARE_PROVIDER_SITE_OTHER): Payer: 59 | Admitting: Nurse Practitioner

## 2014-02-01 ENCOUNTER — Encounter: Payer: Self-pay | Admitting: Nurse Practitioner

## 2014-02-01 VITALS — BP 118/74 | Ht 64.0 in | Wt 144.0 lb

## 2014-02-01 DIAGNOSIS — F909 Attention-deficit hyperactivity disorder, unspecified type: Secondary | ICD-10-CM

## 2014-02-01 DIAGNOSIS — F988 Other specified behavioral and emotional disorders with onset usually occurring in childhood and adolescence: Secondary | ICD-10-CM

## 2014-02-01 DIAGNOSIS — Z789 Other specified health status: Secondary | ICD-10-CM

## 2014-02-01 DIAGNOSIS — F419 Anxiety disorder, unspecified: Secondary | ICD-10-CM

## 2014-02-01 MED ORDER — AMPHETAMINE-DEXTROAMPHETAMINE 10 MG PO TABS: ORAL_TABLET | ORAL | Status: DC

## 2014-02-01 MED ORDER — AMPHETAMINE-DEXTROAMPHET ER 20 MG PO CP24: 20.0000 mg | ORAL_CAPSULE | Freq: Every day | ORAL | Status: DC

## 2014-02-01 MED ORDER — ALPRAZOLAM 0.5 MG PO TABS: ORAL_TABLET | ORAL | Status: DC

## 2014-02-03 ENCOUNTER — Encounter: Payer: Self-pay | Admitting: Nurse Practitioner

## 2014-02-03 NOTE — Progress Notes (Signed)
Subjective:  Presents for routine follow up. Doing well on current dose of Adderall. Sleeping well. Anxiety much better during holiday break from school. Uses rare Xanax mainly during clinical. Does not take it every day.  Objective:   BP 118/74 mmHg  Ht  (1.626 m)  Wt 144 lb (65.318 kg)  BMI 24.71 kg/m2 NAD. Alert, oriented. Lungs clear. Heart RRR.  Assessment: ADD (attention deficit disorder)  Anxiety-related barrier to education  Plan:  Meds ordered this encounter  Medications  . DISCONTD: amphetamine-dextroamphetamine (ADDERALL) 10 MG tablet    Sig: One po in the afternoon    Dispense:  30 tablet    Refill:  0    May fill 60 days from 02/01/14    Order Specific Question:  Supervising Provider    Answer:  Merlyn Albert [[id number]]  . DISCONTD: amphetamine-dextroamphetamine (ADDERALL XR) 20 MG 24 hr capsule    Sig: Take 1 capsule (20 mg total) by mouth daily.    Dispense:  30 capsule    Refill:  0    May fill 60 days from 02/01/14    Order Specific Question:  Supervising Provider    Answer:  Merlyn Albert [[id number]]  . ALPRAZolam (XANAX) 0.5 MG tablet    Sig: 1/2 -1 po qd prn anxiety    Dispense:  30 tablet    Refill:  1    Order Specific Question:  Supervising Provider    Answer:  Merlyn Albert [[id number]]  . DISCONTD: amphetamine-dextroamphetamine (ADDERALL) 10 MG tablet    Sig: One po in the afternoon    Dispense:  30 tablet    Refill:  0    May fill 30 days from 02/01/14    Order Specific Question:  Supervising Provider    Answer:  Merlyn Albert [[id number]]  . DISCONTD: amphetamine-dextroamphetamine (ADDERALL XR) 20 MG 24 hr capsule    Sig: Take 1 capsule (20 mg total) by mouth daily.    Dispense:  30 capsule    Refill:  0    May fill 30 days from 02/01/14    Order Specific Question:  Supervising Provider    Answer:  Merlyn Albert [[id number]]  . DISCONTD: amphetamine-dextroamphetamine (ADDERALL) 10 MG tablet    Sig: One po in the afternoon    Dispense:  30 tablet    Refill:  0    Order Specific Question:  Supervising Provider    Answer:  Merlyn Albert [[id number]]  . DISCONTD: amphetamine-dextroamphetamine (ADDERALL XR) 20 MG 24 hr capsule    Sig: Take 1 capsule (20 mg total) by mouth daily.    Dispense:  30 capsule    Refill:  0    Order Specific Question:  Supervising Provider    Answer:  Merlyn Albert [[id number]]  . DISCONTD: amphetamine-dextroamphetamine (ADDERALL) 10 MG tablet    Sig: One po in the afternoon    Dispense:  30 tablet    Refill:  0    MAY FILL 90 DAYS FROM 02/01/14  . DISCONTD: amphetamine-dextroamphetamine (ADDERALL XR) 20 MG 24 hr capsule    Sig: Take 1 capsule (20 mg total) by mouth daily.    Dispense:  30 capsule    Refill:  0    MAY FILL 90 DAYS FROM 02/01/14  . amphetamine-dextroamphetamine (ADDERALL) 10 MG tablet    Sig: One po in the afternoon    Dispense:  30 tablet    Refill:  0    MAY FILL 90 DAYS  FROM 02/01/14  . amphetamine-dextroamphetamine (ADDERALL XR) 20 MG 24 hr capsule    Sig: Take 1 capsule (20 mg total) by mouth daily.    Dispense:  30 capsule    Refill:  0    MAY FILL 90 DAYS FROM 02/01/14   Given 3 monthly RX per NP with a fourth per MD.  Return in about 4 months (around 06/02/2014) for ADD check up.

## 2014-03-29 ENCOUNTER — Other Ambulatory Visit: Payer: Self-pay | Admitting: Nurse Practitioner

## 2014-04-01 MED ORDER — VALACYCLOVIR HCL 1 G PO TABS: ORAL_TABLET | ORAL | Status: DC

## 2014-04-17 ENCOUNTER — Telehealth: Payer: Self-pay | Admitting: Nurse Practitioner

## 2014-04-17 MED ORDER — ALPRAZOLAM 0.5 MG PO TABS: ORAL_TABLET | ORAL | Status: DC

## 2014-04-17 NOTE — Telephone Encounter (Signed)
Left message on voicemail notifying patient that med was sent to pharmacy.  

## 2014-04-17 NOTE — Telephone Encounter (Signed)
ALPRAZolam (XANAX) 0.5 MG tablet  Pt needs refill on this med, [facility] states they sent the request but  I do not see that happened or that we received it.   Please call an leave a message for the patient to let her know you've sent the  Refill  Last seen 02/01/14

## 2014-04-17 NOTE — Telephone Encounter (Signed)
Ok plus one ref, must last at least one mo

## 2014-05-27 ENCOUNTER — Encounter: Payer: 59 | Admitting: Family Medicine

## 2014-05-27 DIAGNOSIS — Z029 Encounter for administrative examinations, unspecified: Secondary | ICD-10-CM

## 2014-07-22 ENCOUNTER — Ambulatory Visit (INDEPENDENT_AMBULATORY_CARE_PROVIDER_SITE_OTHER): Payer: 59 | Admitting: Nurse Practitioner

## 2014-07-22 ENCOUNTER — Encounter: Payer: Self-pay | Admitting: Nurse Practitioner

## 2014-07-22 VITALS — BP 102/70 | Ht 64.0 in | Wt 148.5 lb

## 2014-07-22 DIAGNOSIS — F909 Attention-deficit hyperactivity disorder, unspecified type: Secondary | ICD-10-CM

## 2014-07-22 DIAGNOSIS — F419 Anxiety disorder, unspecified: Secondary | ICD-10-CM | POA: Diagnosis not present

## 2014-07-22 DIAGNOSIS — F988 Other specified behavioral and emotional disorders with onset usually occurring in childhood and adolescence: Secondary | ICD-10-CM

## 2014-07-22 MED ORDER — ALPRAZOLAM 0.5 MG PO TABS: ORAL_TABLET | ORAL | Status: DC

## 2014-07-22 MED ORDER — AMPHETAMINE-DEXTROAMPHETAMINE 10 MG PO TABS: ORAL_TABLET | ORAL | Status: DC

## 2014-07-22 MED ORDER — AMPHETAMINE-DEXTROAMPHET ER 20 MG PO CP24: 20.0000 mg | ORAL_CAPSULE | Freq: Every day | ORAL | Status: DC

## 2014-07-22 NOTE — Progress Notes (Signed)
Subjective:  Presents for recheck on her ADD and anxiety. Now that she is out of school she has not been taking her Adderall. Will be starting a new job, [place]. Having increased anxiety related to changes going on in her life, denies daily anxiety symptoms. Is unsure at this point whether she will need Adderall when she starts her new job this summer.   Objective:   BP 102/70 mmHg  Ht 5\' 4"  (1.626 m)  Wt 148 lb 8 oz (67.359 kg)  BMI 25.48 kg/m2 NAD. Alert, oriented. Calm affect. Lungs clear. Heart regular rate rhythm.  Assessment:  Problem List Items Addressed This Visit      Other   ADD (attention deficit disorder) - Primary    Other Visit Diagnoses    Anxiety        Relevant Medications    ALPRAZolam (XANAX) 0.5 MG tablet      Plan:  Meds ordered this encounter  Medications  . ALPRAZolam (XANAX) 0.5 MG tablet    Sig: 1/2 -1 po qd prn anxiety    Dispense:  30 tablet    Refill:  5    May refill monthly    Order Specific Question:  Supervising Provider    Answer:  Merlyn AlbertLUKING, WILLIAM S [[id number]]  . amphetamine-dextroamphetamine (ADDERALL XR) 20 MG 24 hr capsule    Sig: Take 1 capsule (20 mg total) by mouth daily.    Dispense:  30 capsule    Refill:  0    Order Specific Question:  Supervising Provider    Answer:  Merlyn AlbertLUKING, WILLIAM S [[id number]]  . amphetamine-dextroamphetamine (ADDERALL) 10 MG tablet    Sig: One po in the afternoon    Dispense:  30 tablet    Refill:  0    Order Specific Question:  Supervising Provider    Answer:  Merlyn AlbertLUKING, WILLIAM S [[id number]]   Use Xanax very sparingly. Maximum 30 per month or less. Defers daily medication at this time. Given 1 prescription for each of her Adderall doses in case she needs this with her new position. If patient wishes to continue to call back in our office we can print refills. Follow-up this fall for preventive health physical.

## 2014-08-06 ENCOUNTER — Encounter: Payer: Self-pay | Admitting: Advanced Practice Midwife

## 2014-08-06 ENCOUNTER — Ambulatory Visit (INDEPENDENT_AMBULATORY_CARE_PROVIDER_SITE_OTHER): Payer: 59 | Admitting: Advanced Practice Midwife

## 2014-08-06 VITALS — BP 100/60 | Ht 64.0 in | Wt 149.0 lb

## 2014-08-06 DIAGNOSIS — Z30017 Encounter for initial prescription of implantable subdermal contraceptive: Secondary | ICD-10-CM

## 2014-08-06 DIAGNOSIS — Z3202 Encounter for pregnancy test, result negative: Secondary | ICD-10-CM | POA: Diagnosis not present

## 2014-08-06 DIAGNOSIS — Z0000 Encounter for general adult medical examination without abnormal findings: Secondary | ICD-10-CM | POA: Insufficient documentation

## 2014-08-06 DIAGNOSIS — Z30018 Encounter for initial prescription of other contraceptives: Secondary | ICD-10-CM

## 2014-08-06 DIAGNOSIS — Z3200 Encounter for pregnancy test, result unknown: Secondary | ICD-10-CM

## 2014-08-06 DIAGNOSIS — Z3046 Encounter for surveillance of implantable subdermal contraceptive: Secondary | ICD-10-CM

## 2014-08-06 LAB — POCT URINE PREGNANCY: PREG TEST UR: NEGATIVE

## 2014-08-06 NOTE — Progress Notes (Signed)
Jacqueline Mendez 24 y.o. Filed Vitals:   08/06/14 1030  BP: 100/60    Past Medical History  Diagnosis Date  . Trichotillomania     2011    Family History  Problem Relation Age of Onset  . Cancer Maternal Grandmother 60    died at 7960   . Hyperlipidemia Father   . Hypertension Father     History   Social History  . Marital Status: Single    Spouse Name: N/A  . Number of Children: N/A  . Years of Education: N/A   Occupational History  . Not on file.   Social History Main Topics  . Smoking status: Never Smoker   . Smokeless tobacco: Never Used  . Alcohol Use: Yes  . Drug Use: No  . Sexual Activity: Yes    Birth Control/ Protection: Implant   Other Topics Concern  . Not on file   Social History Narrative    HPI:  Jacqueline Mendez is here for Nexplanon removal and reinsertion.  She was given informed consent for removal of her Nexplanon and reinsertion of a new one.A signed copy is in the chart.  Appropriate time out taken. Nexlanon site (right arm) identified and thea area was prepped in usual sterile fashon. 2 cc of 1% lidocaine was used to anesthetize the area starting with the distal end of the implant. A small stab incision was made right beside the implant on the distal portion.  The Nexplanon rod was grasped using hemostats and removed without difficulty.  There was less than 3 cc blood loss. There were no complications. Next, the area was cleansed again and the new Nexplanon was inserted without difficulty.  Steri strips and a pressure bandage were applied.  Pt was instructed to remove pressure bandage in a few hours, and keep insertion site covered with a bandaid for 3 days.  Follow-up scheduled PRN problems  CRESENZO-DISHMAN,Niyati Heinke 08/06/2014 10:44 AM

## 2014-08-07 ENCOUNTER — Ambulatory Visit (INDEPENDENT_AMBULATORY_CARE_PROVIDER_SITE_OTHER): Payer: 59 | Admitting: *Deleted

## 2014-08-07 DIAGNOSIS — Z111 Encounter for screening for respiratory tuberculosis: Secondary | ICD-10-CM | POA: Diagnosis not present

## 2014-08-09 LAB — TB SKIN TEST
Induration: 0 mm
TB Skin Test: NEGATIVE

## 2014-09-17 ENCOUNTER — Telehealth: Payer: Self-pay | Admitting: Family Medicine

## 2014-09-17 ENCOUNTER — Other Ambulatory Visit: Payer: Self-pay | Admitting: Nurse Practitioner

## 2014-09-17 ENCOUNTER — Other Ambulatory Visit: Payer: Self-pay | Admitting: *Deleted

## 2014-09-17 NOTE — Telephone Encounter (Signed)
Patient needs Rx for valACYclovir (VALTREX) 1000 MG tablet and ALPRAZolam (XANAX) 0.5 MG tablet   [facility] [address] [place]

## 2014-09-18 NOTE — Telephone Encounter (Signed)
Per Carolyn's note pt was fiven rx for xanax in June thirty with five ref, to ref monthly,(and told not to exceed 30 per mo) why does she need ref of this now??

## 2014-09-18 NOTE — Telephone Encounter (Signed)
Pt notified that refills were sent in June.

## 2014-09-25 ENCOUNTER — Other Ambulatory Visit: Payer: Self-pay | Admitting: Family Medicine

## 2014-09-26 ENCOUNTER — Other Ambulatory Visit: Payer: Self-pay

## 2014-09-26 MED ORDER — VALACYCLOVIR HCL 1 G PO TABS: ORAL_TABLET | ORAL | Status: DC

## 2015-02-05 ENCOUNTER — Encounter: Payer: Self-pay | Admitting: Nurse Practitioner

## 2015-02-06 ENCOUNTER — Other Ambulatory Visit: Payer: Self-pay | Admitting: Nurse Practitioner

## 2015-02-07 ENCOUNTER — Other Ambulatory Visit: Payer: Self-pay | Admitting: Nurse Practitioner

## 2015-02-07 MED ORDER — ALPRAZOLAM 0.5 MG PO TABS: ORAL_TABLET | ORAL | Status: DC

## 2015-02-07 NOTE — Telephone Encounter (Signed)
Prescription to be faxed.

## 2015-09-10 ENCOUNTER — Ambulatory Visit (INDEPENDENT_AMBULATORY_CARE_PROVIDER_SITE_OTHER): Payer: 59 | Admitting: Nurse Practitioner

## 2015-09-10 ENCOUNTER — Encounter: Payer: Self-pay | Admitting: Nurse Practitioner

## 2015-09-10 VITALS — BP 110/68 | Ht 64.5 in | Wt 137.0 lb

## 2015-09-10 DIAGNOSIS — F419 Anxiety disorder, unspecified: Secondary | ICD-10-CM | POA: Diagnosis not present

## 2015-09-10 DIAGNOSIS — F429 Obsessive-compulsive disorder, unspecified: Secondary | ICD-10-CM

## 2015-09-10 DIAGNOSIS — F633 Trichotillomania: Secondary | ICD-10-CM

## 2015-09-10 MED ORDER — ALPRAZOLAM 0.5 MG PO TABS: ORAL_TABLET | ORAL | 2 refills | Status: DC

## 2015-09-10 MED ORDER — VALACYCLOVIR HCL 1 G PO TABS: ORAL_TABLET | ORAL | 0 refills | Status: DC

## 2015-09-10 MED ORDER — PAROXETINE HCL ER 12.5 MG PO TB24: 12.5000 mg | ORAL_TABLET | Freq: Every day | ORAL | 2 refills | Status: DC

## 2015-09-10 NOTE — Progress Notes (Signed)
Subjective:  Presents for complaints of a flareup of her anxiety over the past few weeks. Has not identified any specific source for her anxiety. Was married back in June and did not have any problems at that time. Has Nexplanon for birth control, has no pregnancy planned at this time. Has been pulling out her eyelashes. Occasional mild panic attack. Palpitations. Trouble focusing and short-term memory loss. Similar anxiety symptoms she has had in the past, her last major flareup was several years ago. Has responded well to Paxil in the past.   Objective:   BP 110/68   Ht 5' 4.5" (1.638 m)   Wt 137 lb (62.1 kg)   BMI 23.15 kg/m  NAD. Alert, oriented. Mildly anxious affect. Thoughts logical coherent and relevant. Dressed appropriately. Eyebrows are intact. Eyelashes are missing. Lungs clear. Heart regular rate rhythm.  Assessment:  Problem List Items Addressed This Visit      Other   Anxiety - Primary   Relevant Medications   ALPRAZolam (XANAX) 0.5 MG tablet   PARoxetine (PAXIL-CR) 12.5 MG 24 hr tablet   Obsessive compulsive disorder   Trichotillomania    Other Visit Diagnoses   None.     Plan:  Meds ordered this encounter  Medications  . ALPRAZolam (XANAX) 0.5 MG tablet    Sig: 1/2 -1 po qd prn anxiety    Dispense:  30 tablet    Refill:  2    May refill monthly    Order Specific Question:   Supervising Provider    Answer:   Merlyn AlbertLUKING, WILLIAM S [[id number]]  . PARoxetine (PAXIL-CR) 12.5 MG 24 hr tablet    Sig: Take 1 tablet (12.5 mg total) by mouth daily.    Dispense:  30 tablet    Refill:  2    Order Specific Question:   Supervising Provider    Answer:   Merlyn AlbertLUKING, WILLIAM S [[id number]]  . valACYclovir (VALTREX) 1000 MG tablet    Sig: TAKE 1 TABLET BY MOUTH TWICE DAILY FOR 5 DAYS THEN TAKE 1 TABLET BY MOUTH ONCE DAILY    Dispense:  30 tablet    Refill:  0    Order Specific Question:   Supervising Provider    Answer:   Merlyn AlbertLUKING, WILLIAM S [[id number]]   Continues to take Xanax sparingly.  Restart Paxil low dose daily, may increase to 2 pills per day after at least one week of therapy if needed. DC med and call if any adverse effects. Patient again advised not to take Xanax if she becomes pregnant. Also recommend that she consider mental health counseling. Return in about 4 months (around 01/10/2016) for recheck.

## 2015-11-03 ENCOUNTER — Ambulatory Visit (INDEPENDENT_AMBULATORY_CARE_PROVIDER_SITE_OTHER): Payer: 59 | Admitting: Family Medicine

## 2015-11-03 ENCOUNTER — Encounter: Payer: Self-pay | Admitting: Family Medicine

## 2015-11-03 VITALS — BP 117/76 | Temp 98.5°F | Ht 64.5 in | Wt 136.4 lb

## 2015-11-03 DIAGNOSIS — J4521 Mild intermittent asthma with (acute) exacerbation: Secondary | ICD-10-CM

## 2015-11-03 DIAGNOSIS — J329 Chronic sinusitis, unspecified: Secondary | ICD-10-CM | POA: Diagnosis not present

## 2015-11-03 DIAGNOSIS — J310 Chronic rhinitis: Secondary | ICD-10-CM

## 2015-11-03 DIAGNOSIS — F419 Anxiety disorder, unspecified: Secondary | ICD-10-CM

## 2015-11-03 MED ORDER — BENZONATATE 100 MG PO CAPS: 100.0000 mg | ORAL_CAPSULE | Freq: Four times a day (QID) | ORAL | 0 refills | Status: DC | PRN

## 2015-11-03 MED ORDER — CEFDINIR 300 MG PO CAPS: 300.0000 mg | ORAL_CAPSULE | Freq: Two times a day (BID) | ORAL | 0 refills | Status: DC

## 2015-11-03 MED ORDER — ALBUTEROL SULFATE HFA 108 (90 BASE) MCG/ACT IN AERS: 2.0000 | INHALATION_SPRAY | Freq: Four times a day (QID) | RESPIRATORY_TRACT | 2 refills | Status: DC | PRN

## 2015-11-03 MED ORDER — PAROXETINE HCL 20 MG PO TABS: ORAL_TABLET | ORAL | 5 refills | Status: DC

## 2015-11-03 NOTE — Progress Notes (Signed)
   Subjective:    Patient ID: Jacqueline RotaMary S Mendez, female    DOB: 03/26/1990, 26 y.o.   MRN: [id number]  Cough  This is a new problem. The current episode started in the past 7 days. The cough is non-productive. Associated symptoms include ear pain, headaches, rhinorrhea, a sore throat and wheezing.   Patient states no other concerns this visit.   2005 used inhalder  Pressure in the ears   Noct cough nn ; Cough and cong   Cough is non productive  Review of Systems  HENT: Positive for ear pain, rhinorrhea and sore throat.   Respiratory: Positive for cough and wheezing.   Neurological: Positive for headaches.       Objective:   Physical Exam Alert, mild malaise. Hydration good Vitals stable. frontal/ maxillary tenderness evident positive nasal congestion. pharynx normal neck supple  lungs clear/Faint wheeze with cough s. heart regular in rhythm        Assessment & Plan:  Impression rhinosinusitis likely post viral, discussed with patient. plan antibiotics prescribed. Questions answered. Symptomatic care discussed. warning signs discussed. WSL  Addendum patient has been on tradename Paxil. It is costing her a lot of money. Patient requests generic. States she may need to have a little bit more than she is on. Paxil 21 mg one half to one patient to try half tablet first and in full tablet if necessary

## 2016-01-12 ENCOUNTER — Ambulatory Visit: Payer: 59 | Admitting: Nurse Practitioner

## 2016-02-16 ENCOUNTER — Encounter: Payer: Self-pay | Admitting: Adult Health

## 2016-02-16 ENCOUNTER — Ambulatory Visit (INDEPENDENT_AMBULATORY_CARE_PROVIDER_SITE_OTHER): Payer: 59 | Admitting: Adult Health

## 2016-02-16 ENCOUNTER — Ambulatory Visit (INDEPENDENT_AMBULATORY_CARE_PROVIDER_SITE_OTHER): Payer: 59 | Admitting: Nurse Practitioner

## 2016-02-16 VITALS — BP 102/80 | Temp 98.5°F | Ht 64.5 in | Wt 145.4 lb

## 2016-02-16 VITALS — BP 98/59 | HR 83 | Ht 65.0 in | Wt 145.0 lb

## 2016-02-16 DIAGNOSIS — F422 Mixed obsessional thoughts and acts: Secondary | ICD-10-CM | POA: Diagnosis not present

## 2016-02-16 DIAGNOSIS — J069 Acute upper respiratory infection, unspecified: Secondary | ICD-10-CM

## 2016-02-16 DIAGNOSIS — F419 Anxiety disorder, unspecified: Secondary | ICD-10-CM | POA: Diagnosis not present

## 2016-02-16 DIAGNOSIS — Z3046 Encounter for surveillance of implantable subdermal contraceptive: Secondary | ICD-10-CM

## 2016-02-16 DIAGNOSIS — Z3049 Encounter for surveillance of other contraceptives: Secondary | ICD-10-CM | POA: Diagnosis not present

## 2016-02-16 DIAGNOSIS — R350 Frequency of micturition: Secondary | ICD-10-CM | POA: Diagnosis not present

## 2016-02-16 LAB — POCT URINALYSIS DIPSTICK
Bilirubin, UA: NEGATIVE
Glucose, UA: NEGATIVE
KETONES UA: NEGATIVE
Leukocytes, UA: NEGATIVE
Nitrite, UA: NEGATIVE
PH UA: 6
Protein, UA: NEGATIVE
RBC UA: NEGATIVE
Spec Grav, UA: <=1.005
Urobilinogen, UA: 0.2

## 2016-02-16 MED ORDER — ESCITALOPRAM OXALATE 10 MG PO TABS: 10.0000 mg | ORAL_TABLET | Freq: Every day | ORAL | 2 refills | Status: DC

## 2016-02-16 MED ORDER — AMOXICILLIN-POT CLAVULANATE 875-125 MG PO TABS: 1.0000 | ORAL_TABLET | Freq: Two times a day (BID) | ORAL | 0 refills | Status: DC

## 2016-02-16 NOTE — Patient Instructions (Signed)
,   keep clean and dry x 24 hours, no heavy lifting, keep steri strips on x 72 hours, Keep pressure dressing on x 24 hours. Follow up prn problems.

## 2016-02-16 NOTE — Progress Notes (Signed)
Subjective:     Patient ID: Miguel RotaMary S Im, female   DOB: 06/13/1990, 26 y.o.   MRN: [id number]  HPI Corrie DandyMary "Florentina AddisonKatie" is a [age] year old white female, married, in for nexplanon removal.She declines birth control, wants to get pregnant.  Review of Systems For nexplanon removal Reviewed past medical,surgical, social and family history. Reviewed medications and allergies.     Objective:   Physical Exam BP (!) 98/59 (BP Location: Left Arm, Patient Position: Sitting, Cuff Size: Normal)   Pulse 83   Ht 5\' 5"  (1.651 m)   Wt 145 lb (65.8 kg)   BMI 24.13 kg/m    PHQ 2 score 0.Consent signed, time out called. Right arm cleansed with betadine, and injected with 1.5 cc 1% lidocaine and waited til numb.Under sterile technique a #11 blade was used to make small vertical incision, and a curved forceps was used to easily remove rod. Steri strips applied. Pressure dressing applied. She is aware could take 6-18 months of active trying to get pregnant, discussed timing of sex.  Assessment:     1. Nexplanon removal       Plan:   Keep clean and dry x 24 hours, no heavy lifting, keep steri strips on x 72 hours, Keep pressure dressing on x 24 hours. Follow up prn proble   Take prenatal vitamin with folic acid

## 2016-02-17 ENCOUNTER — Encounter: Payer: Self-pay | Admitting: Nurse Practitioner

## 2016-02-17 NOTE — Progress Notes (Signed)
Subjective:  Presents for recheck on anxiety. Going to GYN today to remove BC. Plans to conceive. Currently on Paxil which is working fairly well. Not as effective as CR form but insurance would not cover this. Mild urinary frequency last night. No dysuria. Mild LBP. No flank pain. No hematuria. Increased fatigue that began about a week ago. Head congestion. Sore throat. Frequent mostly non productive cough. Some wheezing at times. Using albuterol about every 2-3 hours which helps. Had chills initially. Some fever at first. Taking fluids well. Voiding nl.   Objective:   BP 102/80   Temp 98.5 F (36.9 C) (Oral)   Ht 5' 4.5" (1.638 m)   Wt 145 lb 6.4 oz (66 kg)   BMI 24.57 kg/m  NAD. Alert, oriented. TMs clear effusion. Pharynx mild erythema with PND noted. Neck supple with mild anterior adenopathy. Lungs clear. Heart RRR. No CVA tenderness. Abdomen soft, non tender.  Results for orders placed or performed in visit on 02/16/16  POCT urinalysis dipstick  Result Value Ref Range   Color, UA Pale Yellow    Clarity, UA Clear    Glucose, UA Neg    Bilirubin, UA Neg    Ketones, UA Neg    Spec Grav, UA <=1.005    Blood, UA Neg    pH, UA 6.0    Protein, UA Neg    Urobilinogen, UA 0.2    Nitrite, UA Neg    Leukocytes, UA Negative Negative     Assessment:   Problem List Items Addressed This Visit      Other   Anxiety   Relevant Medications   escitalopram (LEXAPRO) 10 MG tablet   Obsessive compulsive disorder - Primary    Other Visit Diagnoses    Urinary frequency       Relevant Orders   POCT urinalysis dipstick (Completed)   Acute upper respiratory infection        .   Plan:  Meds ordered this encounter  Medications  . escitalopram (LEXAPRO) 10 MG tablet    Sig: Take 1 tablet (10 mg total) by mouth daily.    Dispense:  30 tablet    Refill:  2    Order Specific Question:   Supervising Provider    Answer:   Merlyn AlbertLUKING, WILLIAM S [[id number]]  . amoxicillin-clavulanate (AUGMENTIN)  875-125 MG tablet    Sig: Take 1 tablet by mouth 2 (two) times daily.    Dispense:  20 tablet    Refill:  0    Order Specific Question:   Supervising Provider    Answer:   Merlyn AlbertLUKING, WILLIAM S [[id number]]   Wean off Paxil this week. Switch to Lexapro which is a safer choice during pregnancy. Call back if any problems. Stop all Xanax due to category X for pregnancy. Patient verbalizes understanding. OTC meds as directed for congestion and cough. Call back by end of the week if no improvement, sooner if worse. Use back up method while on meds for URI. Return in about 1 month (around 03/18/2016) for recheck on new med.

## 2016-04-13 ENCOUNTER — Other Ambulatory Visit: Payer: Self-pay | Admitting: *Deleted

## 2016-04-13 ENCOUNTER — Other Ambulatory Visit: Payer: Self-pay | Admitting: Nurse Practitioner

## 2016-04-13 MED ORDER — ESCITALOPRAM OXALATE 10 MG PO TABS: 10.0000 mg | ORAL_TABLET | Freq: Every day | ORAL | 0 refills | Status: DC

## 2016-05-19 ENCOUNTER — Encounter: Payer: Self-pay | Admitting: Family Medicine

## 2016-05-19 ENCOUNTER — Other Ambulatory Visit (HOSPITAL_COMMUNITY)
Admission: RE | Admit: 2016-05-19 | Discharge: 2016-05-19 | Disposition: A | Discharge status: Home / Self Care | Payer: 59 | Source: Ambulatory Visit | Attending: Family Medicine | Admitting: Family Medicine

## 2016-05-19 ENCOUNTER — Ambulatory Visit (INDEPENDENT_AMBULATORY_CARE_PROVIDER_SITE_OTHER): Payer: 59 | Admitting: Family Medicine

## 2016-05-19 VITALS — BP 114/72 | Temp 98.2°F | Ht 64.5 in | Wt 148.4 lb

## 2016-05-19 DIAGNOSIS — R5383 Other fatigue: Secondary | ICD-10-CM

## 2016-05-19 DIAGNOSIS — R1030 Lower abdominal pain, unspecified: Secondary | ICD-10-CM | POA: Insufficient documentation

## 2016-05-19 LAB — CBC WITH DIFFERENTIAL/PLATELET
Basophils Absolute: 0 10*3/uL (ref 0.0–0.1)
Basophils Relative: 1 %
EOS ABS: 0.1 10*3/uL (ref 0.0–0.7)
EOS PCT: 2 %
HCT: 40 % (ref 36.0–46.0)
Hemoglobin: 13.8 g/dL (ref 12.0–15.0)
LYMPHS ABS: 1.4 10*3/uL (ref 0.7–4.0)
Lymphocytes Relative: 27 %
MCH: 32.2 pg (ref 26.0–34.0)
MCHC: 34.5 g/dL (ref 30.0–36.0)
MCV: 93.2 fL (ref 78.0–100.0)
Monocytes Absolute: 0.3 10*3/uL (ref 0.1–1.0)
Monocytes Relative: 7 %
Neutro Abs: 3.2 10*3/uL (ref 1.7–7.7)
Neutrophils Relative %: 63 %
PLATELETS: 159 10*3/uL (ref 150–400)
RBC: 4.29 MIL/uL (ref 3.87–5.11)
RDW: 12.2 % (ref 11.5–15.5)
WBC: 5.1 10*3/uL (ref 4.0–10.5)

## 2016-05-19 LAB — TSH: TSH: 1.348 u[IU]/mL (ref 0.350–4.500)

## 2016-05-19 LAB — BASIC METABOLIC PANEL
Anion gap: 7 (ref 5–15)
BUN: 10 mg/dL (ref 6–20)
CHLORIDE: 105 mmol/L (ref 101–111)
CO2: 26 mmol/L (ref 22–32)
CREATININE: 0.55 mg/dL (ref 0.44–1.00)
Calcium: 9 mg/dL (ref 8.9–10.3)
GFR calc Af Amer: >60 mL/min (ref >60)
GFR calc non Af Amer: >60 mL/min (ref >60)
GLUCOSE: 92 mg/dL (ref 65–99)
POTASSIUM: 3.6 mmol/L (ref 3.5–5.1)
SODIUM: 138 mmol/L (ref 135–145)

## 2016-05-19 LAB — HEPATIC FUNCTION PANEL
ALT: 14 U/L (ref 14–54)
AST: 17 U/L (ref 15–41)
Albumin: 4.3 g/dL (ref 3.5–5.0)
Alkaline Phosphatase: 42 U/L (ref 38–126)
BILIRUBIN DIRECT: 0.1 mg/dL (ref 0.1–0.5)
BILIRUBIN INDIRECT: 0.7 mg/dL (ref 0.3–0.9)
Total Bilirubin: 0.8 mg/dL (ref 0.3–1.2)
Total Protein: 6.9 g/dL (ref 6.5–8.1)

## 2016-05-19 LAB — POCT URINE PREGNANCY: Preg Test, Ur: NEGATIVE

## 2016-05-19 LAB — HCG, QUANTITATIVE, PREGNANCY

## 2016-05-19 MED ORDER — ESCITALOPRAM OXALATE 10 MG PO TABS: 10.0000 mg | ORAL_TABLET | Freq: Every day | ORAL | 2 refills | Status: DC

## 2016-05-19 NOTE — Progress Notes (Signed)
   Subjective:    Patient ID: Jacqueline Mendez, female    DOB: [birth year], 26 y.o.   MRN: [id number]  Abdominal Pain  This is a new problem. The current episode started yesterday. The pain is located in the suprapubic region. Associated symptoms comments: Hand edema, pedal edema. .   implanon t be renoved since 22 of January    Last period feb 25 thru 28   Lower pelvic abd pain and cramping  Took e s tylenol  ceter not unilaterl pain, was worried about I    Last cycle little bit in feb  No menses, pos fam hx   scond times almost three yrs plus sec on for two more yrs  Cycles can be off and on for few months    Ned sstt b w  Pain ovrall beter now  No tyle tood ay   Patient accompanied by her mother. She's worried about appendicitis. She's also worried about potential for an ectopic pregnancy. She has been trying get pregnant. Had the Implanon removed several months ago    No fever no appetite better now    Patient has concerns of lower abdominal pain and swelling. Patient states has not had menstrual cycle in 2 months. Has concerns of possible pregnancy, has been trying to become pregnant.   Results for orders placed or performed in visit on 05/19/16  POCT urine pregnancy  Result Value Ref Range   Preg Test, Ur Negative Negative    Review of Systems  Gastrointestinal: Positive for abdominal pain.   No headache no chest pain no fever    Objective:   Physical Exam Alert and oriented, vitals reviewed and stable, NAD ENT-TM's and ext canals WNL bilat via otoscopic exam Soft palate, tonsils and post pharynx WNL via oropharyngeal exam Neck-symmetric, no masses; thyroid nonpalpable and nontender Pulmonary-no tachypnea or accessory muscle use; Clear without wheezes via auscultation Card--no abnrml murmurs, rhythm reg and rate WNL Carotid pulses symmetric, without bruits Slight puffiness hands and feet noted low abdomen some tenderness deep palpation left lower  abdominal region   Sent for urgent labs negative quantitative hCG. White blood count normal other blood work good    Assessment & Plan:  Impression impressive pelvic pain after 2 months same manner diarrhea and 3 months after implant in removal. With negative hCG chance of ectopic pregnancy 0. With normal white blood count highly unlikely appendicitis or similar. May represent ovarian cyst. May represent atypical premenstrual cramps warning signs discussed carefully based on results

## 2016-09-07 ENCOUNTER — Other Ambulatory Visit: Payer: Self-pay | Admitting: Family Medicine

## 2016-09-07 NOTE — Telephone Encounter (Signed)
Last seen 02/19/16

## 2016-10-08 ENCOUNTER — Telehealth: Payer: Self-pay | Admitting: Family Medicine

## 2016-10-08 ENCOUNTER — Other Ambulatory Visit: Payer: Self-pay | Admitting: *Deleted

## 2016-10-08 MED ORDER — FLUCONAZOLE 150 MG PO TABS: ORAL_TABLET | ORAL | 0 refills | Status: DC

## 2016-10-08 MED ORDER — VALACYCLOVIR HCL 1 G PO TABS: ORAL_TABLET | ORAL | 3 refills | Status: DC

## 2016-10-08 NOTE — Telephone Encounter (Signed)
Also, would like Rx for Valtrex.

## 2016-10-08 NOTE — Telephone Encounter (Signed)
Prescribed valtrex 08/2015. Requesting refill. Also wants rx for yeast infection

## 2016-10-08 NOTE — Telephone Encounter (Signed)
Refill on valtrex for fever blister. Also wants something for yeast infection for her and her husband ( husband has a separate message sent back) vaginal itching, burning, white discharge, clumpy and an odor.

## 2016-10-08 NOTE — Telephone Encounter (Signed)
Valtrex 1 g two now and two 12 hrs later numb 4 three ref, diflucan 150 numb 2 one p o three d apart

## 2016-10-08 NOTE — Telephone Encounter (Signed)
[facility] [address]

## 2016-10-08 NOTE — Telephone Encounter (Signed)
meds sent to pharm. Pt notified.  

## 2016-10-08 NOTE — Telephone Encounter (Signed)
Patient has a yeast infection and would like Rx called in for this.  She said she has tried [employer] but no relief.  [facility] [address] [place], [place]

## 2016-11-15 ENCOUNTER — Telehealth: Payer: Self-pay | Admitting: Family Medicine

## 2016-11-15 NOTE — Telephone Encounter (Signed)
[facility] is [facility] [address].  She is hoping for a bigger supply than what we have been giving so that she can have it on hand.

## 2016-11-16 MED ORDER — VALACYCLOVIR HCL 1 G PO TABS: ORAL_TABLET | ORAL | 3 refills | Status: DC

## 2016-11-16 NOTE — Telephone Encounter (Signed)
Prescription sent electronically to pharmacy. Patient notified. 

## 2016-11-16 NOTE — Telephone Encounter (Signed)
Try twelve tabs, take two immed then two two hrs later during flare, 3 ref

## 2016-12-03 ENCOUNTER — Other Ambulatory Visit: Payer: Self-pay | Admitting: Nurse Practitioner

## 2016-12-03 ENCOUNTER — Telehealth: Payer: Self-pay | Admitting: Family Medicine

## 2016-12-03 MED ORDER — VALACYCLOVIR HCL 1 G PO TABS: ORAL_TABLET | ORAL | 0 refills | Status: DC

## 2016-12-03 NOTE — Telephone Encounter (Signed)
Patient is requesting Rx for Valtrex sent in for a 30 day supply.  She said what was called in last time isn't enough to help her flare up.  [facility] [address] [place]

## 2016-12-03 NOTE — Telephone Encounter (Signed)
Sent in 30 pills which is standard Rx. She may take up to twice per day prn.

## 2016-12-03 NOTE — Telephone Encounter (Signed)
I called left vm to r/c

## 2016-12-03 NOTE — Telephone Encounter (Signed)
Patient is aware 

## 2016-12-28 ENCOUNTER — Encounter: Payer: Self-pay | Admitting: Family Medicine

## 2016-12-30 ENCOUNTER — Other Ambulatory Visit: Payer: Self-pay | Admitting: Nurse Practitioner

## 2016-12-30 NOTE — Telephone Encounter (Signed)
Last seen in April for abd pain

## 2017-03-08 ENCOUNTER — Other Ambulatory Visit: Payer: Self-pay | Admitting: Family Medicine

## 2017-03-08 MED ORDER — FLUCONAZOLE 150 MG PO TABS: ORAL_TABLET | ORAL | 0 refills | Status: DC

## 2017-03-08 NOTE — Telephone Encounter (Signed)
Pt is needing something called in for a yeast inf.     [facility] [address]

## 2017-03-08 NOTE — Telephone Encounter (Signed)
Per protocol:  Diflucan 150 mg #2 one tablet 3 days a part  Prescription sent electronically to pharmacy. Patient notified.

## 2017-04-22 ENCOUNTER — Ambulatory Visit (INDEPENDENT_AMBULATORY_CARE_PROVIDER_SITE_OTHER): Payer: 59 | Admitting: Nurse Practitioner

## 2017-04-22 ENCOUNTER — Encounter: Payer: Self-pay | Admitting: Nurse Practitioner

## 2017-04-22 ENCOUNTER — Ambulatory Visit: Payer: 59 | Admitting: Nurse Practitioner

## 2017-04-22 VITALS — BP 102/62 | Ht 64.5 in | Wt 140.8 lb

## 2017-04-22 DIAGNOSIS — F419 Anxiety disorder, unspecified: Secondary | ICD-10-CM

## 2017-04-22 DIAGNOSIS — F422 Mixed obsessional thoughts and acts: Secondary | ICD-10-CM

## 2017-04-22 DIAGNOSIS — F902 Attention-deficit hyperactivity disorder, combined type: Secondary | ICD-10-CM

## 2017-04-22 DIAGNOSIS — F633 Trichotillomania: Secondary | ICD-10-CM

## 2017-04-22 DIAGNOSIS — Z23 Encounter for immunization: Secondary | ICD-10-CM

## 2017-04-22 MED ORDER — ALPRAZOLAM 0.5 MG PO TABS: ORAL_TABLET | ORAL | 2 refills | Status: DC

## 2017-04-22 MED ORDER — AMPHETAMINE-DEXTROAMPHET ER 20 MG PO CP24: 20.0000 mg | ORAL_CAPSULE | ORAL | 0 refills | Status: DC

## 2017-04-22 NOTE — Patient Instructions (Signed)
Send my chart message in 2 weeks to let us know how this regimen is working and to see if we need to add Zoloft

## 2017-04-23 ENCOUNTER — Encounter: Payer: Self-pay | Admitting: Nurse Practitioner

## 2017-04-23 NOTE — Progress Notes (Signed)
Subjective:  Presents for flare up of her anxiety. Working as a Armed forces operational officerdental hygienist. Happily married. Husband had surgery recently which added to her stress. Off Lexapro since December since it was not helping. Has also been trying to conceive but wants to hold off for now until her anxiety is better. On her cycle today. Having OCD symptoms. Has pulled out all of her eyebrows. Denies suicidal or homicidal thoughts or ideation. Has not responded well to several SSRIs to this point. States she responded best to daily Adderall with occasional Xanax use.  GAD 7 : Generalized Anxiety Score 04/23/2017  Nervous, Anxious, on Edge 3  Control/stop worrying 3  Worry too much - different things 3  Trouble relaxing 3  Restless 3  Easily annoyed or irritable 3  Afraid - awful might happen 3  Total GAD 7 Score 21  Anxiety Difficulty Extremely difficult    Objective:   BP 102/62   Ht 5' 4.5" (1.638 m)   Wt 140 lb 12.8 oz (63.9 kg)   BMI 23.80 kg/m  NAD. Alert, oriented. Thoughts logical, coherent and relevant. Dressed appropriately. Crying during most of visit. Making good eye contact. Lungs clear. Heart RRR.   Assessment:   Problem List Items Addressed This Visit      Other   ADD (attention deficit disorder) - Primary   Anxiety   Relevant Medications   ALPRAZolam (XANAX) 0.5 MG tablet   Obsessive compulsive disorder   Relevant Medications   ALPRAZolam (XANAX) 0.5 MG tablet   Trichotillomania    Other Visit Diagnoses    Need for vaccination       Relevant Orders   Flu Vaccine QUAD 36+ mos IM (Completed)       Plan:   Meds ordered this encounter  Medications  . DISCONTD: ALPRAZolam (XANAX) 0.5 MG tablet    Sig: 1/2 -1 po qd prn anxiety    Dispense:  30 tablet    Refill:  2    May refill monthly    Order Specific Question:   Supervising Provider    Answer:   Merlyn AlbertLUKING, WILLIAM S [[id number]]  . amphetamine-dextroamphetamine (ADDERALL XR) 20 MG 24 hr capsule    Sig: Take 1 capsule (20 mg total)  by mouth every morning.    Dispense:  30 capsule    Refill:  0    Order Specific Question:   Supervising Provider    Answer:   Merlyn AlbertLUKING, WILLIAM S [[id number]]  . ALPRAZolam (XANAX) 0.5 MG tablet    Sig: 1/2 -1 po qd prn anxiety    Dispense:  30 tablet    Refill:  2    May refill monthly    Order Specific Question:   Supervising Provider    Answer:   Merlyn AlbertLUKING, WILLIAM S [[id number]]   Restart Adderall and Xanax. Patient understands these meds should not be taken if pregnant. Will consistently use condoms. Does not plan pregnancy at this point. Contact office in 2 weeks if this regimen is not working; plan to add Zoloft at that time. Return in about 3 months (around 07/23/2017) for recheck.

## 2017-05-02 ENCOUNTER — Encounter: Payer: Self-pay | Admitting: Nurse Practitioner

## 2017-05-04 ENCOUNTER — Other Ambulatory Visit: Payer: Self-pay | Admitting: Nurse Practitioner

## 2017-05-05 ENCOUNTER — Other Ambulatory Visit: Payer: Self-pay | Admitting: Nurse Practitioner

## 2017-05-05 MED ORDER — VALACYCLOVIR HCL 1 G PO TABS: ORAL_TABLET | ORAL | 5 refills | Status: DC

## 2017-05-06 DIAGNOSIS — Z124 Encounter for screening for malignant neoplasm of cervix: Secondary | ICD-10-CM | POA: Diagnosis not present

## 2017-05-06 DIAGNOSIS — R102 Pelvic and perineal pain: Secondary | ICD-10-CM | POA: Diagnosis not present

## 2017-05-12 DIAGNOSIS — R102 Pelvic and perineal pain: Secondary | ICD-10-CM | POA: Diagnosis not present

## 2017-05-20 DIAGNOSIS — R102 Pelvic and perineal pain: Secondary | ICD-10-CM | POA: Diagnosis not present

## 2017-05-27 DIAGNOSIS — R102 Pelvic and perineal pain: Secondary | ICD-10-CM | POA: Diagnosis not present

## 2017-05-31 ENCOUNTER — Encounter: Payer: Self-pay | Admitting: Nurse Practitioner

## 2017-06-01 ENCOUNTER — Other Ambulatory Visit: Payer: Self-pay | Admitting: Nurse Practitioner

## 2017-06-01 MED ORDER — AMPHETAMINE-DEXTROAMPHET ER 20 MG PO CP24: 20.0000 mg | ORAL_CAPSULE | ORAL | 0 refills | Status: DC

## 2017-06-10 DIAGNOSIS — R102 Pelvic and perineal pain: Secondary | ICD-10-CM | POA: Diagnosis not present

## 2017-06-13 ENCOUNTER — Telehealth: Payer: Self-pay | Admitting: *Deleted

## 2017-06-13 NOTE — Telephone Encounter (Signed)
Fax from [facility]. Amphet/dextr cap  has been approved through 06/14/2018. Pt notified on voicemail and pharm [facility] [address] notified.

## 2017-06-24 DIAGNOSIS — N970 Female infertility associated with anovulation: Secondary | ICD-10-CM | POA: Diagnosis not present

## 2017-06-24 DIAGNOSIS — E282 Polycystic ovarian syndrome: Secondary | ICD-10-CM | POA: Diagnosis not present

## 2017-07-04 ENCOUNTER — Ambulatory Visit: Payer: BLUE CROSS/BLUE SHIELD | Admitting: Family Medicine

## 2017-07-04 ENCOUNTER — Encounter: Payer: Self-pay | Admitting: Family Medicine

## 2017-07-04 VITALS — BP 108/82 | HR 87 | Temp 98.7°F | Ht 64.5 in | Wt 139.0 lb

## 2017-07-04 DIAGNOSIS — J4521 Mild intermittent asthma with (acute) exacerbation: Secondary | ICD-10-CM | POA: Diagnosis not present

## 2017-07-04 DIAGNOSIS — J329 Chronic sinusitis, unspecified: Secondary | ICD-10-CM

## 2017-07-04 MED ORDER — ALBUTEROL SULFATE HFA 108 (90 BASE) MCG/ACT IN AERS: 2.0000 | INHALATION_SPRAY | Freq: Four times a day (QID) | RESPIRATORY_TRACT | 2 refills | Status: DC | PRN

## 2017-07-04 MED ORDER — CEFDINIR 300 MG PO CAPS: 300.0000 mg | ORAL_CAPSULE | Freq: Two times a day (BID) | ORAL | 0 refills | Status: DC

## 2017-07-04 NOTE — Progress Notes (Signed)
   Subjective:    Patient ID: Jacqueline Mendez, female    DOB: 01/14/1991, 27 y.o.   MRN: [id number]  HPI Patient is here today with sinus drainage,trouble breathing,cough,wheezing,sorethroat started coughing this am and then vomited. This started around May 27,2019 she was given a zpack,she has used mucinex,benedrryl, and Financial controllerallergra.  Took z p got to feeling better   Sore throat again coughing and sinus  Thought that it must benadry and allegra, andjust in cause it was a cold took a fough med  Had dranage when   Coughing ooff and on  Hit hard initally    Used albuterol and it helped, took in the past for wheezing    No fever no   Major challente   Some night cough, increases ,   Started  z pk day of misery    Review of Systems No headache, no major weight loss or weight gain, no chest pain no back pain abdominal pain no change in bowel habits complete ROS otherwise negative     Objective:   Physical Exam  Alert, mild malaise. Hydration good Vitals stable. frontal/ maxillary tenderness evident positive nasal congestion. pharynx normal neck supple  lungs clear/no crackles or wheezes. heart regular in rhythm    And also add albuterol 2 sprays 4 times daily as needed for wheezing      Assessment & Plan:  Impression rhinosinusitis likely post viral, discussed with patient. plan antibiotics prescribed. Questions answered. Symptomatic care discussed. warning signs discussed. WSL

## 2017-07-11 DIAGNOSIS — N970 Female infertility associated with anovulation: Secondary | ICD-10-CM | POA: Diagnosis not present

## 2017-07-22 ENCOUNTER — Ambulatory Visit: Payer: 59 | Admitting: Nurse Practitioner

## 2017-07-22 DIAGNOSIS — N979 Female infertility, unspecified: Secondary | ICD-10-CM | POA: Diagnosis not present

## 2017-07-22 DIAGNOSIS — E282 Polycystic ovarian syndrome: Secondary | ICD-10-CM | POA: Diagnosis not present

## 2017-07-22 DIAGNOSIS — N970 Female infertility associated with anovulation: Secondary | ICD-10-CM | POA: Diagnosis not present

## 2017-08-11 ENCOUNTER — Other Ambulatory Visit: Payer: Self-pay | Admitting: Nurse Practitioner

## 2017-08-11 ENCOUNTER — Encounter: Payer: Self-pay | Admitting: Nurse Practitioner

## 2017-08-11 ENCOUNTER — Telehealth: Payer: Self-pay | Admitting: Family Medicine

## 2017-08-11 DIAGNOSIS — R102 Pelvic and perineal pain: Secondary | ICD-10-CM | POA: Diagnosis not present

## 2017-08-11 MED ORDER — ALPRAZOLAM 0.5 MG PO TABS: ORAL_TABLET | ORAL | 2 refills | Status: DC

## 2017-08-11 NOTE — Telephone Encounter (Signed)
Pt requesting refill on her ALPRAZolam Prudy Feeler(XANAX) 0.5 MG tablet   [facility]/[place]   Please call pt when done

## 2017-08-11 NOTE — Telephone Encounter (Signed)
Faxed to pharmacy and pt notified

## 2017-08-11 NOTE — Telephone Encounter (Signed)
Script printed and awaiting signature. 

## 2017-08-11 NOTE — Telephone Encounter (Signed)
Last seen July 04, 2017 for Rhinosinusitis.

## 2017-08-11 NOTE — Telephone Encounter (Signed)
Ok plus 2 ref 

## 2017-08-22 DIAGNOSIS — N979 Female infertility, unspecified: Secondary | ICD-10-CM | POA: Diagnosis not present

## 2017-09-09 DIAGNOSIS — N979 Female infertility, unspecified: Secondary | ICD-10-CM | POA: Diagnosis not present

## 2017-09-21 DIAGNOSIS — R102 Pelvic and perineal pain: Secondary | ICD-10-CM | POA: Diagnosis not present

## 2017-09-21 DIAGNOSIS — N979 Female infertility, unspecified: Secondary | ICD-10-CM | POA: Diagnosis not present

## 2017-09-21 DIAGNOSIS — N970 Female infertility associated with anovulation: Secondary | ICD-10-CM | POA: Diagnosis not present

## 2017-10-07 DIAGNOSIS — N970 Female infertility associated with anovulation: Secondary | ICD-10-CM | POA: Diagnosis not present

## 2017-10-07 DIAGNOSIS — N979 Female infertility, unspecified: Secondary | ICD-10-CM | POA: Diagnosis not present

## 2017-11-07 ENCOUNTER — Other Ambulatory Visit: Payer: Self-pay

## 2017-11-07 DIAGNOSIS — N979 Female infertility, unspecified: Secondary | ICD-10-CM | POA: Diagnosis not present

## 2017-11-07 NOTE — Telephone Encounter (Signed)
Sorry last seen march for these concerms needs o v first

## 2017-12-16 DIAGNOSIS — Z3149 Encounter for other procreative investigation and testing: Secondary | ICD-10-CM | POA: Diagnosis not present

## 2017-12-16 DIAGNOSIS — N970 Female infertility associated with anovulation: Secondary | ICD-10-CM | POA: Diagnosis not present

## 2018-01-06 DIAGNOSIS — Z3149 Encounter for other procreative investigation and testing: Secondary | ICD-10-CM | POA: Diagnosis not present

## 2018-01-10 ENCOUNTER — Other Ambulatory Visit: Payer: Self-pay | Admitting: Family Medicine

## 2018-01-10 NOTE — Telephone Encounter (Signed)
Ok times one, before further ref will need o v to re evalkuate

## 2018-01-12 ENCOUNTER — Other Ambulatory Visit: Payer: Self-pay | Admitting: Obstetrics and Gynecology

## 2018-01-12 DIAGNOSIS — Z3149 Encounter for other procreative investigation and testing: Secondary | ICD-10-CM

## 2018-01-24 ENCOUNTER — Ambulatory Visit (INDEPENDENT_AMBULATORY_CARE_PROVIDER_SITE_OTHER): Payer: BLUE CROSS/BLUE SHIELD | Admitting: Family Medicine

## 2018-01-24 ENCOUNTER — Encounter: Payer: Self-pay | Admitting: Family Medicine

## 2018-01-24 VITALS — BP 124/82 | Ht 64.5 in | Wt 145.6 lb

## 2018-01-24 DIAGNOSIS — Z114 Encounter for screening for human immunodeficiency virus [HIV]: Secondary | ICD-10-CM

## 2018-01-24 DIAGNOSIS — F902 Attention-deficit hyperactivity disorder, combined type: Secondary | ICD-10-CM

## 2018-01-24 DIAGNOSIS — Z23 Encounter for immunization: Secondary | ICD-10-CM

## 2018-01-24 DIAGNOSIS — F419 Anxiety disorder, unspecified: Secondary | ICD-10-CM | POA: Diagnosis not present

## 2018-01-24 DIAGNOSIS — Z13220 Encounter for screening for lipoid disorders: Secondary | ICD-10-CM

## 2018-01-24 DIAGNOSIS — Z0000 Encounter for general adult medical examination without abnormal findings: Secondary | ICD-10-CM | POA: Diagnosis not present

## 2018-01-24 MED ORDER — AMPHETAMINE-DEXTROAMPHET ER 20 MG PO CP24: 20.0000 mg | ORAL_CAPSULE | ORAL | 0 refills | Status: DC

## 2018-01-24 MED ORDER — ALPRAZOLAM 0.5 MG PO TABS: ORAL_TABLET | ORAL | 0 refills | Status: DC

## 2018-01-24 NOTE — Progress Notes (Signed)
Subjective:    Patient ID: Jacqueline Mendez, female    DOB: 08/30/1990, 27 y.o.   MRN: [id number]  HPI The patient comes in today for a wellness visit.  A review of their health history was completed. A review of medications was also completed.  Any needed refills; xanax and adderall  Eating habits: health conscious, mostly drinks water and diet coke.   Falls/  MVA accidents in past few months: none  Regular exercise: walking about 1 time per week.  Specialist pt sees on regular basis: [facility]   Preventative health issues were discussed. Gets regular dental exams.   Additional concerns: [facility] this year. Pt would like breast exam. Pt and husband have been trying to conceive for about 2 years.     Takes xanax as needed for her anxiety, takes 1 tablet maybe once a month. Reports taking pregnancy tests before hand. Denies SI/HI.   ADD: had stopped taking adderall in May of this year d/t trying to get pregnant, but since being off of it has had issues with being able to focus and having issues with pulling out hair periodically. States the adderall helps with both.   LMP: 01/09/18-01/14/18; Denies current issues with vaginal itching, discharge, burning. Sexually active with 1 partner. Seen regularly by OBGYN for fertility  Review of Systems  Constitutional: Negative for appetite change, chills, fatigue, fever and unexpected weight change.  HENT: Negative for congestion, ear pain, sinus pressure, sinus pain and sore throat.   Eyes: Negative for discharge and visual disturbance.  Respiratory: Negative for cough, shortness of breath and wheezing.   Cardiovascular: Negative for chest pain and leg swelling.  Gastrointestinal: Negative for abdominal pain, blood in stool, constipation, diarrhea, nausea and vomiting.  Genitourinary: Negative for difficulty urinating, hematuria, menstrual problem, pelvic pain, vaginal  bleeding and vaginal discharge.  Neurological: Negative for dizziness, weakness, light-headedness and headaches.  Psychiatric/Behavioral: Positive for decreased concentration. Negative for dysphoric mood, sleep disturbance and suicidal ideas.  All other systems reviewed and are negative.      Objective:   Physical Exam Vitals signs and nursing note reviewed.  Constitutional:      General: She is not in acute distress.    Appearance: She is well-developed.  HENT:     Head: Normocephalic and atraumatic.     Right Ear: Tympanic membrane normal.     Left Ear: Tympanic membrane normal.     Nose: Nose normal.     Mouth/Throat:     Mouth: Mucous membranes are moist.     Pharynx: Oropharynx is clear. Uvula midline.  Eyes:     General:        Right eye: No discharge.        Left eye: No discharge.     Conjunctiva/sclera: Conjunctivae normal.     Pupils: Pupils are equal, round, and reactive to light.  Neck:     Musculoskeletal: Neck supple.     Thyroid: No thyromegaly.  Cardiovascular:     Rate and Rhythm: Normal rate and regular rhythm.     Heart sounds: Normal heart sounds. No murmur.  Pulmonary:     Effort: Pulmonary effort is normal. No respiratory distress.     Breath sounds: Normal breath sounds. No wheezing.  Chest:     Breasts: Breasts are symmetrical.        Right: Normal.        Left: Normal.  Abdominal:  General: Bowel sounds are normal. There is no distension.     Palpations: Abdomen is soft. There is no mass.     Tenderness: There is no abdominal tenderness.  Genitourinary:    Comments: Defers GU exam, regular visits with OBGYN Musculoskeletal:        General: No deformity.  Lymphadenopathy:     Cervical: No cervical adenopathy.     Upper Body:     Right upper body: No supraclavicular or axillary adenopathy.     Left upper body: No supraclavicular or axillary adenopathy.  Skin:    General: Skin is warm and dry.  Neurological:     Mental Status: She is  alert and oriented to person, place, and time.     Coordination: Coordination normal.  Psychiatric:        Mood and Affect: Mood normal.           Assessment & Plan:  1. Well adult exam - Plan: Lipid Profile, Hepatic function panel, Basic Metabolic Panel (BMET), CBC with Differential, HIV antibody (with reflex) Adult wellness-complete.wellness physical was conducted today. Importance of diet and exercise were discussed in detail.  In addition to this a discussion regarding safety was also covered. We also reviewed over immunizations and gave recommendations regarding current immunization needed for age.   -Flu shot today In addition to this additional areas were also touched on including: Preventative health exams needed:  Colonoscopy N/A Mammogram N/A Pap Smear: done earlier this year per patient by OBGYN, normal per pt.  Patient was advised yearly wellness exam. Screening labs ordered, will notify of results.  2. Attention deficit hyperactivity disorder (ADHD), combined type Pt would like to restart adderall medication. Risks and benefits discussed. She will notify us if she becomes pregnant. PMP database checked. Refills sent in. F/u in 3 months.   3. Anxiety Doing well with rare use of xanax. PMP database checked. Will send in 1 refill to have on file. Encouraged continued rare use. Warning signs discussed.   4. Screening for lipid disorders - Plan: Lipid Profile  5. Need for vaccination - Plan: Flu Vaccine QUAD 6+ mos PF IM (Fluarix Quad PF)  6. Screening for HIV (human immunodeficiency virus) - Plan: HIV antibody (with reflex) Screening per CDC guidelines  Dr. Lubertha SouthSteve Luking was consulted on this case and is in agreement with the above treatment plan.

## 2018-01-25 LAB — CBC WITH DIFFERENTIAL/PLATELET
Basophils Absolute: 0 10*3/uL (ref 0.0–0.2)
Basos: 1 %
EOS (ABSOLUTE): 0.1 10*3/uL (ref 0.0–0.4)
Eos: 2 %
Hematocrit: 43.4 % (ref 34.0–46.6)
Hemoglobin: 14.6 g/dL (ref 11.1–15.9)
IMMATURE GRANS (ABS): 0 10*3/uL (ref 0.0–0.1)
Immature Granulocytes: 0 %
Lymphocytes Absolute: 1.4 10*3/uL (ref 0.7–3.1)
Lymphs: 30 %
MCH: 31.3 pg (ref 26.6–33.0)
MCHC: 33.6 g/dL (ref 31.5–35.7)
MCV: 93 fL (ref 79–97)
Monocytes Absolute: 0.4 10*3/uL (ref 0.1–0.9)
Monocytes: 7 %
Neutrophils Absolute: 2.8 10*3/uL (ref 1.4–7.0)
Neutrophils: 60 %
Platelets: 206 10*3/uL (ref 150–450)
RBC: 4.67 x10E6/uL (ref 3.77–5.28)
RDW: 12 % — ABNORMAL LOW (ref 12.3–15.4)
WBC: 4.7 10*3/uL (ref 3.4–10.8)

## 2018-01-25 LAB — LIPID PANEL
Chol/HDL Ratio: 3.4 ratio (ref 0.0–4.4)
Cholesterol, Total: 242 mg/dL — ABNORMAL HIGH (ref 100–199)
HDL: 71 mg/dL (ref >39)
LDL CALC: 158 mg/dL — AB (ref 0–99)
TRIGLYCERIDES: 65 mg/dL (ref 0–149)
VLDL CHOLESTEROL CAL: 13 mg/dL (ref 5–40)

## 2018-01-25 LAB — HEPATIC FUNCTION PANEL
ALT: 17 IU/L (ref 0–32)
AST: 16 IU/L (ref 0–40)
Albumin: 5 g/dL (ref 3.5–5.5)
Alkaline Phosphatase: 45 IU/L (ref 39–117)
Bilirubin Total: 1 mg/dL (ref 0.0–1.2)
Bilirubin, Direct: 0.24 mg/dL (ref 0.00–0.40)
Total Protein: 7.4 g/dL (ref 6.0–8.5)

## 2018-01-25 LAB — BASIC METABOLIC PANEL
BUN/Creatinine Ratio: 14 (ref 9–23)
BUN: 10 mg/dL (ref 6–20)
CO2: 23 mmol/L (ref 20–29)
Calcium: 9.9 mg/dL (ref 8.7–10.2)
Chloride: 102 mmol/L (ref 96–106)
Creatinine, Ser: 0.71 mg/dL (ref 0.57–1.00)
GFR calc non Af Amer: 116 mL/min/{1.73_m2} (ref >59)
GFR, EST AFRICAN AMERICAN: 134 mL/min/{1.73_m2} (ref >59)
Glucose: 95 mg/dL (ref 65–99)
Potassium: 4.5 mmol/L (ref 3.5–5.2)
Sodium: 140 mmol/L (ref 134–144)

## 2018-01-25 LAB — HIV ANTIBODY (ROUTINE TESTING W REFLEX): HIV SCREEN 4TH GENERATION: NONREACTIVE

## 2018-01-31 DIAGNOSIS — Z3149 Encounter for other procreative investigation and testing: Secondary | ICD-10-CM | POA: Diagnosis not present

## 2018-02-16 DIAGNOSIS — Z7721 Contact with and (suspected) exposure to potentially hazardous body fluids: Secondary | ICD-10-CM | POA: Diagnosis not present

## 2018-02-21 ENCOUNTER — Encounter: Payer: Self-pay | Admitting: Obstetrics and Gynecology

## 2018-02-21 ENCOUNTER — Ambulatory Visit
Admission: RE | Admit: 2018-02-21 | Discharge: 2018-02-21 | Disposition: A | Discharge status: Home / Self Care | Payer: BLUE CROSS/BLUE SHIELD | Source: Ambulatory Visit | Attending: Obstetrics and Gynecology | Admitting: Obstetrics and Gynecology

## 2018-02-21 ENCOUNTER — Ambulatory Visit: Payer: 59

## 2018-02-21 DIAGNOSIS — Z3149 Encounter for other procreative investigation and testing: Secondary | ICD-10-CM | POA: Diagnosis not present

## 2018-02-21 DIAGNOSIS — Z113 Encounter for screening for infections with a predominantly sexual mode of transmission: Secondary | ICD-10-CM | POA: Diagnosis not present

## 2018-02-21 DIAGNOSIS — Z3200 Encounter for pregnancy test, result unknown: Secondary | ICD-10-CM | POA: Diagnosis not present

## 2018-02-21 DIAGNOSIS — N970 Female infertility associated with anovulation: Secondary | ICD-10-CM | POA: Diagnosis not present

## 2018-02-21 MED ORDER — IOPAMIDOL (ISOVUE-370) INJECTION 76%
50.0000 mL | Freq: Once | INTRAVENOUS | Status: AC | PRN
  Administered 2018-02-21: 15 mL

## 2018-02-21 NOTE — Progress Notes (Unsigned)
[id number]  1990-11-22 y.o. 02/21/18 Jacqueline Douglas, MD  [facility]  Hysterosalpingogram Procedure Note  Date of procedure: 02/21/2018   Pre-operative Diagnosis: Infertility  Post-operative Diagnosis: same  Procedure: Hysterosalpingogram  Surgeon: Cline Cools, MD  Assistant(s):  Radiology assistant. The radiologist present for today read the imaging and agreed with findings below.  Anesthesia: None  Estimated Blood Loss:  None         Complications:  None; patient tolerated the procedure well.         Disposition: To home         Condition: stable  Findings: Bilateral fill and spill of the tubes and a normal endometrial contour was noted. The left tube was sluggish but responded with normal fill and spill.  Procedure Details  HSG procedure discussed with the patient.  Risks, complications, alternatives have been reviewed with her and she agrees to proceed.   The patient presented to the radiology lab and was identified as the correct patient and the procedure verified as an HSG. A verbal Time Out was held with all team members present and in agreement.  Speculum was inserted in to the vagina and the cervix visualized.  The cervix was cleaned with betadine solution. The HSG catheter was inserted and the balloon insufflated with approximately 1.5 ml of air.  Patient was then repositioned for fluoroscopy.  A total of 15 ml of contrast was used for the procedure. The patient tolerated the procedure well, no complications.   Bilateral fill and spill of the tubes and a normal endometrial contour was noted.  Results were reviewed with the patient at the time of the procedure. She verbalized understanding.   Jacqueline Douglas, MD 02/21/2018

## 2018-03-06 ENCOUNTER — Encounter: Payer: Self-pay | Admitting: Family Medicine

## 2018-03-06 ENCOUNTER — Telehealth: Payer: Self-pay

## 2018-03-06 MED ORDER — GENTAMICIN SULFATE 0.3 % OP SOLN: 2.0000 [drp] | Freq: Four times a day (QID) | OPHTHALMIC | 0 refills | Status: AC

## 2018-03-06 NOTE — Telephone Encounter (Signed)
Patient would like a note for work stating she maybe contagious with the pink eye. 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 Please advise.

## 2018-03-06 NOTE — Telephone Encounter (Signed)
72 hrs

## 2018-03-07 NOTE — Telephone Encounter (Signed)
Left message to return call 

## 2018-03-08 NOTE — Telephone Encounter (Signed)
Left message to return call 

## 2018-03-09 ENCOUNTER — Encounter: Payer: Self-pay | Admitting: Family Medicine

## 2018-03-09 NOTE — Telephone Encounter (Signed)
Message and work note sent via my chart

## 2018-04-14 ENCOUNTER — Ambulatory Visit: Payer: BLUE CROSS/BLUE SHIELD | Admitting: Family Medicine

## 2018-04-17 DIAGNOSIS — N912 Amenorrhea, unspecified: Secondary | ICD-10-CM | POA: Diagnosis not present

## 2018-05-16 DIAGNOSIS — Z3401 Encounter for supervision of normal first pregnancy, first trimester: Secondary | ICD-10-CM | POA: Diagnosis not present

## 2018-05-16 DIAGNOSIS — Z113 Encounter for screening for infections with a predominantly sexual mode of transmission: Secondary | ICD-10-CM | POA: Diagnosis not present

## 2018-05-16 DIAGNOSIS — N898 Other specified noninflammatory disorders of vagina: Secondary | ICD-10-CM | POA: Diagnosis not present

## 2018-08-03 ENCOUNTER — Other Ambulatory Visit: Payer: Self-pay | Admitting: *Deleted

## 2018-08-03 DIAGNOSIS — R6889 Other general symptoms and signs: Secondary | ICD-10-CM | POA: Diagnosis not present

## 2018-08-03 DIAGNOSIS — Z20822 Contact with and (suspected) exposure to covid-19: Secondary | ICD-10-CM

## 2018-08-08 LAB — NOVEL CORONAVIRUS, NAA: SARS-CoV-2, NAA: NOT DETECTED

## 2018-09-15 DIAGNOSIS — N898 Other specified noninflammatory disorders of vagina: Secondary | ICD-10-CM | POA: Diagnosis not present

## 2018-09-15 DIAGNOSIS — Z23 Encounter for immunization: Secondary | ICD-10-CM | POA: Diagnosis not present

## 2018-09-15 DIAGNOSIS — Z3403 Encounter for supervision of normal first pregnancy, third trimester: Secondary | ICD-10-CM | POA: Diagnosis not present

## 2018-09-15 DIAGNOSIS — Z3402 Encounter for supervision of normal first pregnancy, second trimester: Secondary | ICD-10-CM | POA: Diagnosis not present

## 2018-09-22 DIAGNOSIS — R1011 Right upper quadrant pain: Secondary | ICD-10-CM | POA: Diagnosis not present

## 2018-09-22 DIAGNOSIS — O26899 Other specified pregnancy related conditions, unspecified trimester: Secondary | ICD-10-CM | POA: Diagnosis not present

## 2018-10-13 DIAGNOSIS — Z23 Encounter for immunization: Secondary | ICD-10-CM | POA: Diagnosis not present

## 2018-10-29 ENCOUNTER — Observation Stay
Admission: EM | Admit: 2018-10-29 | Discharge: 2018-10-29 | Disposition: A | Discharge status: Home / Self Care | Payer: BC Managed Care – PPO | Attending: Obstetrics and Gynecology | Admitting: Obstetrics and Gynecology

## 2018-10-29 ENCOUNTER — Other Ambulatory Visit: Payer: Self-pay

## 2018-10-29 DIAGNOSIS — O26893 Other specified pregnancy related conditions, third trimester: Principal | ICD-10-CM | POA: Insufficient documentation

## 2018-10-29 DIAGNOSIS — Z91040 Latex allergy status: Secondary | ICD-10-CM | POA: Diagnosis not present

## 2018-10-29 DIAGNOSIS — Z79899 Other long term (current) drug therapy: Secondary | ICD-10-CM | POA: Diagnosis not present

## 2018-10-29 DIAGNOSIS — N930 Postcoital and contact bleeding: Secondary | ICD-10-CM | POA: Diagnosis not present

## 2018-10-29 DIAGNOSIS — Z885 Allergy status to narcotic agent status: Secondary | ICD-10-CM | POA: Insufficient documentation

## 2018-10-29 DIAGNOSIS — Z3A34 34 weeks gestation of pregnancy: Secondary | ICD-10-CM | POA: Insufficient documentation

## 2018-10-29 DIAGNOSIS — O99891 Other specified diseases and conditions complicating pregnancy: Secondary | ICD-10-CM | POA: Diagnosis not present

## 2018-10-29 DIAGNOSIS — N898 Other specified noninflammatory disorders of vagina: Secondary | ICD-10-CM | POA: Diagnosis present

## 2018-10-29 DIAGNOSIS — N939 Abnormal uterine and vaginal bleeding, unspecified: Secondary | ICD-10-CM | POA: Diagnosis not present

## 2018-10-29 HISTORY — DX: Other specified health status: Z78.9

## 2018-10-29 LAB — URINALYSIS, COMPLETE (UACMP) WITH MICROSCOPIC
Bacteria, UA: NONE SEEN
Bilirubin Urine: NEGATIVE
Glucose, UA: NEGATIVE mg/dL
Ketones, ur: NEGATIVE mg/dL
Leukocytes,Ua: NEGATIVE
Nitrite: NEGATIVE
Protein, ur: NEGATIVE mg/dL
RBC / HPF: >50 RBC/hpf — ABNORMAL HIGH (ref 0–5)
Specific Gravity, Urine: 1.008 (ref 1.005–1.030)
pH: 8 (ref 5.0–8.0)

## 2018-10-29 LAB — WET PREP, GENITAL
Clue Cells Wet Prep HPF POC: NONE SEEN
Sperm: NONE SEEN
Trich, Wet Prep: NONE SEEN
Yeast Wet Prep HPF POC: NONE SEEN

## 2018-10-29 NOTE — OB Triage Note (Signed)
D/C instructions reviewed with patient. Provider discussed lab results and pt has no further questions at this time. Pt is discharged with significant other and given instructions given on when to call or come back to the hospital.

## 2018-10-29 NOTE — OB Triage Note (Signed)
Pt is a G1P0 at [place] that presents from ED with c/o ctx and Bright red VB that started around 20 minutes ago. Pt states positive FM, Denies LOF.Monitors applied and initial FHT 135. Provider at bedside for vaginal exam

## 2018-10-29 NOTE — Discharge Summary (Signed)
Jacqueline Mendez is a 28 y.o. female. She is at [redacted]w[redacted]d. Patient's last menstrual period was 02/11/2018 (exact date). 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  Duration: constant Quality: bright red heavy bleeding, running down her legs.  Severity: n/a Aggravating or alleviating conditions: bleeding started during intercourse Associated signs/symptoms: mild cramping and pressure.  Context: seen earlier this week at [facility] office.   S: Resting comfortably. no CTX, no VB.no LOF,  Active fetal movement. Denies: HA, visual changes, SOB, or RUQ/epigastric pain  Maternal Medical History:   Past Medical History:  Diagnosis Date  . Herpes simplex virus (HSV) infection   . Medical history non-contributory   . Trichotillomania    2011    Past Surgical History:  Procedure Laterality Date  . arm surgery Bilateral    broken arm x4   . BREAST ENHANCEMENT SURGERY      Allergies  Allergen Reactions  . Codeine   . Latex     Prior to Admission medications   Medication Sig Start Date End Date Taking? Authorizing Provider  ALPRAZolam Prudy Feeler) 0.5 MG tablet TAKE 1/2 TO 1 TABLET BY MOUTH EVERY DAY AS NEEDED FOR ANXIETY 01/24/18   Jeannine Boga, NP  amphetamine-dextroamphetamine (ADDERALL XR) 20 MG 24 hr capsule Take 1 capsule (20 mg total) by mouth every morning. 02/23/18   Jeannine Boga, NP  amphetamine-dextroamphetamine (ADDERALL XR) 20 MG 24 hr capsule Take 1 capsule (20 mg total) by mouth every morning. 01/24/18   Jeannine Boga, NP  amphetamine-dextroamphetamine (ADDERALL XR) 20 MG 24 hr capsule Take 1 capsule (20 mg total) by mouth every morning. 03/25/18   Jeannine Boga, NP  valACYclovir (VALTREX)  1000 MG tablet TAKE 1 TABLET BY MOUTH EVERY DAY AS NEEDED 05/05/17   Campbell Riches, NP      Social History: She  reports that she has never smoked. She has never used smokeless tobacco. She reports current alcohol use. She reports that she does not use drugs.  Family History: family history includes Alzheimer's disease in her maternal grandfather; Cancer in her maternal grandfather; Cancer (age of onset: 72) in her maternal grandmother; Heart attack in her paternal grandfather; Hyperlipidemia in her father; Hypertension in her father.   Review of Systems: A full review of systems was performed and negative except as noted in the HPI.     O:  BP 118/70   Pulse 75   Temp 98.2 F (36.8 C) (Oral)   Ht 5' 4.5" (1.638 m)   Wt 83 kg   LMP 02/11/2018 (Exact Date)   BMI 30.93 kg/m  Results for orders placed or performed during the hospital encounter of 10/29/18 (from the past 48 hour(s))  Wet prep, genital   Collection Time: 10/29/18 11:17 AM   Specimen: Cervix  Result Value Ref Range   Yeast Wet Prep HPF POC NONE SEEN NONE SEEN   Trich, Wet Prep NONE SEEN NONE SEEN   Clue Cells Wet Prep HPF POC NONE SEEN NONE SEEN   WBC, Wet Prep HPF POC RARE (A) NONE SEEN   Sperm NONE SEEN   Urinalysis, Complete w Microscopic   Collection Time: 10/29/18 11:17 AM  Result Value Ref Range   Color, Urine YELLOW (A) YELLOW  APPearance CLEAR (A) CLEAR   Specific Gravity, Urine 1.008 1.005 - 1.030   pH 8.0 5.0 - 8.0   Glucose, UA NEGATIVE NEGATIVE mg/dL   Hgb urine dipstick LARGE (A) NEGATIVE   Bilirubin Urine NEGATIVE NEGATIVE   Ketones, ur NEGATIVE NEGATIVE mg/dL   Protein, ur NEGATIVE NEGATIVE mg/dL   Nitrite NEGATIVE NEGATIVE   Leukocytes,Ua NEGATIVE NEGATIVE   RBC / HPF >50 (H) 0 - 5 RBC/hpf   WBC, UA 0-5 0 - 5 WBC/hpf   Bacteria, UA NONE SEEN NONE SEEN   Squamous Epithelial / LPF 0-5 0 - 5   Mucus PRESENT      Constitutional: NAD, AAOx3  HE/ENT: extraocular movements grossly intact,  moist mucous membranes CV: RRR PULM: nl respiratory effort, CTABL     Abd: gravid, non-tender, non-distended, soft      Ext: Non-tender, Nonedematous   Psych: mood appropriate, speech normal Pelvic: SSE done- friable cervix with ectropion noted, bright red bleeding noted, single 3cm clot noted in vaginal vault. Multiple large q-tips used to absorb blood and light pressure applied with hemostasis achieved.   Toco: mild uterine irritability  Fetal  monitoring: Cat I Appropriate for GA Baseline: 140bpm Variability: moderate Accelerations: present x >2 Decelerations absent Time 34mins    A/P: 28 y.o. G1P0 at [redacted]w[redacted]d here for antenatal surveillance for vaginal bleeding  Principle Diagnosis:  Vaginal bleeding- postcoital, [redacted]w[redacted]d   Preterm labor: not present.   Fetal Wellbeing: Reassuring Cat 1 tracing, Reactive NST   Discussed friable cervix and post-coital bleeding, now resolved. Recommended pelvic rest x 1 week.   D/c home stable, precautions reviewed, follow-up as scheduled.    Francetta Found, CNM 10/29/2018  1:02 PM

## 2018-11-17 DIAGNOSIS — Z3403 Encounter for supervision of normal first pregnancy, third trimester: Secondary | ICD-10-CM | POA: Diagnosis not present

## 2018-12-06 ENCOUNTER — Observation Stay
Admission: EM | Admit: 2018-12-06 | Discharge: 2018-12-06 | Disposition: A | Discharge status: Home / Self Care | Payer: BC Managed Care – PPO | Attending: Certified Nurse Midwife | Admitting: Certified Nurse Midwife

## 2018-12-06 ENCOUNTER — Other Ambulatory Visit: Payer: Self-pay

## 2018-12-06 DIAGNOSIS — O26893 Other specified pregnancy related conditions, third trimester: Principal | ICD-10-CM | POA: Insufficient documentation

## 2018-12-06 DIAGNOSIS — B009 Herpesviral infection, unspecified: Secondary | ICD-10-CM | POA: Insufficient documentation

## 2018-12-06 DIAGNOSIS — Z3A39 39 weeks gestation of pregnancy: Secondary | ICD-10-CM | POA: Insufficient documentation

## 2018-12-06 DIAGNOSIS — O99891 Other specified diseases and conditions complicating pregnancy: Secondary | ICD-10-CM | POA: Diagnosis not present

## 2018-12-06 DIAGNOSIS — R519 Headache, unspecified: Secondary | ICD-10-CM | POA: Diagnosis not present

## 2018-12-06 DIAGNOSIS — O98513 Other viral diseases complicating pregnancy, third trimester: Secondary | ICD-10-CM | POA: Insufficient documentation

## 2018-12-06 DIAGNOSIS — O163 Unspecified maternal hypertension, third trimester: Secondary | ICD-10-CM | POA: Diagnosis present

## 2018-12-06 DIAGNOSIS — Z0379 Encounter for other suspected maternal and fetal conditions ruled out: Secondary | ICD-10-CM | POA: Diagnosis not present

## 2018-12-06 DIAGNOSIS — R030 Elevated blood-pressure reading, without diagnosis of hypertension: Secondary | ICD-10-CM | POA: Diagnosis not present

## 2018-12-06 DIAGNOSIS — R000 Tachycardia, unspecified: Secondary | ICD-10-CM | POA: Diagnosis not present

## 2018-12-06 LAB — COMPREHENSIVE METABOLIC PANEL
ALT: 14 U/L (ref 0–44)
AST: 20 U/L (ref 15–41)
Albumin: 3 g/dL — ABNORMAL LOW (ref 3.5–5.0)
Alkaline Phosphatase: 99 U/L (ref 38–126)
Anion gap: 11 (ref 5–15)
BUN: 8 mg/dL (ref 6–20)
CO2: 21 mmol/L — ABNORMAL LOW (ref 22–32)
Calcium: 8.8 mg/dL — ABNORMAL LOW (ref 8.9–10.3)
Chloride: 107 mmol/L (ref 98–111)
Creatinine, Ser: 0.43 mg/dL — ABNORMAL LOW (ref 0.44–1.00)
GFR calc Af Amer: >60 mL/min (ref >60)
GFR calc non Af Amer: >60 mL/min (ref >60)
Glucose, Bld: 145 mg/dL — ABNORMAL HIGH (ref 70–99)
Potassium: 3.6 mmol/L (ref 3.5–5.1)
Sodium: 139 mmol/L (ref 135–145)
Total Bilirubin: 0.7 mg/dL (ref 0.3–1.2)
Total Protein: 6.5 g/dL (ref 6.5–8.1)

## 2018-12-06 LAB — CBC
HCT: 37.2 % (ref 36.0–46.0)
Hemoglobin: 13.1 g/dL (ref 12.0–15.0)
MCH: 31.3 pg (ref 26.0–34.0)
MCHC: 35.2 g/dL (ref 30.0–36.0)
MCV: 88.8 fL (ref 80.0–100.0)
Platelets: 160 10*3/uL (ref 150–400)
RBC: 4.19 MIL/uL (ref 3.87–5.11)
RDW: 13.8 % (ref 11.5–15.5)
WBC: 6.4 10*3/uL (ref 4.0–10.5)
nRBC: 0 % (ref 0.0–0.2)

## 2018-12-06 LAB — PROTEIN / CREATININE RATIO, URINE
Creatinine, Urine: 140 mg/dL
Protein Creatinine Ratio: 0.06 mg/mg{Cre} (ref 0.00–0.15)
Total Protein, Urine: 8 mg/dL

## 2018-12-06 MED ORDER — ACETAMINOPHEN 500 MG PO TABS: 1000.0000 mg | ORAL_TABLET | Freq: Four times a day (QID) | ORAL | Status: DC | PRN

## 2018-12-06 NOTE — OB Triage Note (Signed)
Patient presented to L&D for evaluation due to elevated heart rate on patients apple watch and elevated blood pressures noted at home.  Patient reports having a headache, spotty vision and feeling dizzy.

## 2018-12-06 NOTE — Discharge Summary (Signed)
Jacqueline Mendez is a 28 y.o. female. She is at [redacted]w[redacted]d gestation. Patient's last menstrual period was 02/11/2018 (exact date). Estimated Date of Delivery: 12/10/18  Prenatal care site: [facility]   Chief complaint: elevated BP at home Location: today Onset/timing: once Duration: n/a Quality: n/a Severity: n/a Aggravating or alleviating conditions: n/a Associated signs/symptoms: headache  Context: Jacqueline Mendez reports that she had a headache last night. It resolved but came back today. It has since resolved. Her watch alerted her that her heart rate was 120-140bpm at times. She had BPs of 135/110 and 125/80 at home.   S: Resting comfortably.   She reports:  -active fetal movement -no leakage of fluid -no vaginal bleeding -occasional contractions  Maternal Medical History:   Past Medical History:  Diagnosis Date  . Herpes simplex virus (HSV) infection   . Medical history non-contributory   . Trichotillomania    2011    Past Surgical History:  Procedure Laterality Date  . arm surgery Bilateral    broken arm x4   . BREAST ENHANCEMENT SURGERY      Allergies  Allergen Reactions  . Codeine   . Latex     Prior to Admission medications   Medication Sig Start Date End Date Taking? Authorizing Provider  Prenatal Vit-Fe Fumarate-FA (MULTIVITAMIN-PRENATAL) 27-0.8 MG TABS tablet Take 1 tablet by mouth daily at 12 noon.   Yes [provider]  valACYclovir (VALTREX) 1000 MG tablet TAKE 1 TABLET BY MOUTH EVERY DAY AS NEEDED 05/05/17  Yes Campbell Riches, NP  ALPRAZolam (XANAX) 0.5 MG tablet TAKE 1/2 TO 1 TABLET BY MOUTH EVERY DAY AS NEEDED FOR ANXIETY Patient not taking: Reported on 12/06/2018 01/24/18   Jeannine Boga, NP  amphetamine-dextroamphetamine (ADDERALL XR) 20 MG 24 hr capsule Take 1 capsule (20 mg total) by mouth every morning. Patient not taking: Reported on 12/06/2018 02/23/18   Jeannine Boga, NP  amphetamine-dextroamphetamine (ADDERALL XR) 20 MG  24 hr capsule Take 1 capsule (20 mg total) by mouth every morning. Patient not taking: Reported on 12/06/2018 01/24/18   Jeannine Boga, NP  amphetamine-dextroamphetamine (ADDERALL XR) 20 MG 24 hr capsule Take 1 capsule (20 mg total) by mouth every morning. Patient not taking: Reported on 12/06/2018 03/25/18   Jeannine Boga, NP     Social History: She  reports that she has never smoked. She has never used smokeless tobacco. She reports current alcohol use. She reports that she does not use drugs.  Family History: family history includes Alzheimer's disease in her maternal grandfather; Cancer in her maternal grandfather; Cancer (age of onset: 60) in her maternal grandmother; Heart attack in her paternal grandfather; Hyperlipidemia in her father; Hypertension in her father.   Review of Systems: A full review of systems was performed and negative except as noted in the HPI.     O:  BP 119/73   Pulse 96   Temp 97.9 F (36.6 C) (Oral)   Resp 18   Ht 5\' 4"  (1.626 m)   Wt 86.2 kg   LMP 02/11/2018 (Exact Date)   SpO2 97%   BMI 32.61 kg/m  Results for orders placed or performed during the hospital encounter of 12/06/18 (from the past 48 hour(s))  Comprehensive metabolic panel   Collection Time: 12/06/18  4:14 PM  Result Value Ref Range   Sodium 139 135 - 145 mmol/L   Potassium 3.6 3.5 - 5.1 mmol/L   Chloride 107 98 - 111 mmol/L   CO2 21 (L) 22 -  32 mmol/L   Glucose, Bld 145 (H) 70 - 99 mg/dL   BUN 8 6 - 20 mg/dL   Creatinine, Ser 0.43 (L) 0.44 - 1.00 mg/dL   Calcium 8.8 (L) 8.9 - 10.3 mg/dL   Total Protein 6.5 6.5 - 8.1 g/dL   Albumin 3.0 (L) 3.5 - 5.0 g/dL   AST 20 15 - 41 U/L   ALT 14 0 - 44 U/L   Alkaline Phosphatase 99 38 - 126 U/L   Total Bilirubin 0.7 0.3 - 1.2 mg/dL   GFR calc non Af Amer >60 >60 mL/min   GFR calc Af Amer >60 >60 mL/min   Anion gap 11 5 - 15  CBC on admission   Collection Time: 12/06/18  4:14 PM  Result Value Ref Range   WBC 6.4 4.0 - 10.5 K/uL    RBC 4.19 3.87 - 5.11 MIL/uL   Hemoglobin 13.1 12.0 - 15.0 g/dL   HCT 37.2 36.0 - 46.0 %   MCV 88.8 80.0 - 100.0 fL   MCH 31.3 26.0 - 34.0 pg   MCHC 35.2 30.0 - 36.0 g/dL   RDW 13.8 11.5 - 15.5 %   Platelets 160 150 - 400 K/uL   nRBC 0.0 0.0 - 0.2 %  Protein / creatinine ratio, urine   Collection Time: 12/06/18  4:25 PM  Result Value Ref Range   Creatinine, Urine 140 mg/dL   Total Protein, Urine 8 mg/dL   Protein Creatinine Ratio 0.06 0.00 - 0.15 mg/mg[Cre]     Constitutional: NAD, AAOx3  HE/ENT: extraocular movements grossly intact, moist mucous membranes CV: RRR PULM: normal respiratory effort, CTABL     Abd: gravid, non-tender, non-distended, soft      Ext: Non-tender, Nonedmeatous   Psych: mood appropriate, speech normal Pelvic deferred  NST:  Baseline: 125bpm Variability: moderate Accelerations: 15x15 present x >2 Decelerations: absent Time: 40mins Toco: quiet   A/P: 28 y.o. [redacted]w[redacted]d here for antenatal surveillance during pregnancy.  Principle diagnosis: normal first pregnancy in third trimester  Labor  Not present  Fetal Wellbeing  Reactive NST, reassuring for GA  PIH workup  Blood pressures grossly normal. CBC, CMP, and P/C ratio without evidence of preeclampsia.   D/c home stable, precautions reviewed, follow-up as scheduled.    Jacqueline Mendez 12/06/2018 5:42 PM  ----- Jacqueline Mendez, CNM Certified Nurse Midwife [facility], Department of [facility]

## 2018-12-08 ENCOUNTER — Inpatient Hospital Stay
Admission: EM | Admit: 2018-12-08 | Discharge: 2018-12-11 | DRG: 787 | Disposition: A | Discharge status: Home / Self Care | Payer: BC Managed Care – PPO | Attending: Obstetrics and Gynecology | Admitting: Obstetrics and Gynecology

## 2018-12-08 ENCOUNTER — Other Ambulatory Visit: Payer: Self-pay

## 2018-12-08 DIAGNOSIS — A6000 Herpesviral infection of urogenital system, unspecified: Secondary | ICD-10-CM | POA: Diagnosis present

## 2018-12-08 DIAGNOSIS — O339 Maternal care for disproportion, unspecified: Principal | ICD-10-CM | POA: Diagnosis present

## 2018-12-08 DIAGNOSIS — O9912 Other diseases of the blood and blood-forming organs and certain disorders involving the immune mechanism complicating childbirth: Secondary | ICD-10-CM | POA: Diagnosis present

## 2018-12-08 DIAGNOSIS — Z3A39 39 weeks gestation of pregnancy: Secondary | ICD-10-CM

## 2018-12-08 DIAGNOSIS — Z9889 Other specified postprocedural states: Secondary | ICD-10-CM

## 2018-12-08 DIAGNOSIS — Z20828 Contact with and (suspected) exposure to other viral communicable diseases: Secondary | ICD-10-CM | POA: Diagnosis present

## 2018-12-08 DIAGNOSIS — O9832 Other infections with a predominantly sexual mode of transmission complicating childbirth: Secondary | ICD-10-CM | POA: Diagnosis present

## 2018-12-08 DIAGNOSIS — D696 Thrombocytopenia, unspecified: Secondary | ICD-10-CM | POA: Diagnosis present

## 2018-12-08 NOTE — OB Triage Note (Signed)
Pt presents to L&D with c/o ctx every 2-3 minutes lasting 45-60 seconds long starting around 2030. Pt rates ctx 6/10 on a 0-10 pain scale. Pt reports ctx are wrapping around from her back and covering her entire abdomen. Pt was seen at [facility] today for a membrane sweep. Pt confirms positive fetal movement and reports bloody show. Vital signs WDL. Will continue to monitor.

## 2018-12-09 ENCOUNTER — Inpatient Hospital Stay: Payer: BC Managed Care – PPO | Admitting: Anesthesiology

## 2018-12-09 ENCOUNTER — Encounter: Payer: Self-pay | Admitting: Anesthesiology

## 2018-12-09 ENCOUNTER — Encounter
Admission: EM | Disposition: A | Discharge status: Home / Self Care | Payer: Self-pay | Source: Home / Self Care | Attending: Obstetrics and Gynecology

## 2018-12-09 DIAGNOSIS — Z9889 Other specified postprocedural states: Secondary | ICD-10-CM

## 2018-12-09 DIAGNOSIS — O26893 Other specified pregnancy related conditions, third trimester: Secondary | ICD-10-CM | POA: Diagnosis not present

## 2018-12-09 DIAGNOSIS — O9832 Other infections with a predominantly sexual mode of transmission complicating childbirth: Secondary | ICD-10-CM | POA: Diagnosis not present

## 2018-12-09 DIAGNOSIS — O9912 Other diseases of the blood and blood-forming organs and certain disorders involving the immune mechanism complicating childbirth: Secondary | ICD-10-CM | POA: Diagnosis not present

## 2018-12-09 DIAGNOSIS — Z23 Encounter for immunization: Secondary | ICD-10-CM | POA: Diagnosis not present

## 2018-12-09 DIAGNOSIS — Z3A39 39 weeks gestation of pregnancy: Secondary | ICD-10-CM | POA: Diagnosis not present

## 2018-12-09 DIAGNOSIS — O339 Maternal care for disproportion, unspecified: Secondary | ICD-10-CM | POA: Diagnosis not present

## 2018-12-09 DIAGNOSIS — A6000 Herpesviral infection of urogenital system, unspecified: Secondary | ICD-10-CM | POA: Diagnosis not present

## 2018-12-09 DIAGNOSIS — D696 Thrombocytopenia, unspecified: Secondary | ICD-10-CM | POA: Diagnosis not present

## 2018-12-09 DIAGNOSIS — Z20828 Contact with and (suspected) exposure to other viral communicable diseases: Secondary | ICD-10-CM | POA: Diagnosis not present

## 2018-12-09 LAB — CBC WITH DIFFERENTIAL/PLATELET
Abs Immature Granulocytes: 0.05 10*3/uL (ref 0.00–0.07)
Basophils Absolute: 0 10*3/uL (ref 0.0–0.1)
Basophils Relative: 0 %
Eosinophils Absolute: 0.1 10*3/uL (ref 0.0–0.5)
Eosinophils Relative: 1 %
HCT: 40 % (ref 36.0–46.0)
Hemoglobin: 13.7 g/dL (ref 12.0–15.0)
Immature Granulocytes: 1 %
Lymphocytes Relative: 15 %
Lymphs Abs: 1.5 10*3/uL (ref 0.7–4.0)
MCH: 31.6 pg (ref 26.0–34.0)
MCHC: 34.3 g/dL (ref 30.0–36.0)
MCV: 92.4 fL (ref 80.0–100.0)
Monocytes Absolute: 0.7 10*3/uL (ref 0.1–1.0)
Monocytes Relative: 7 %
Neutro Abs: 7.8 10*3/uL — ABNORMAL HIGH (ref 1.7–7.7)
Neutrophils Relative %: 76 %
Platelets: 160 10*3/uL (ref 150–400)
RBC: 4.33 MIL/uL (ref 3.87–5.11)
RDW: 13.8 % (ref 11.5–15.5)
WBC: 10.2 10*3/uL (ref 4.0–10.5)
nRBC: 0 % (ref 0.0–0.2)

## 2018-12-09 LAB — TYPE AND SCREEN
ABO/RH(D): A POS
Antibody Screen: NEGATIVE

## 2018-12-09 LAB — RPR: RPR Ser Ql: NONREACTIVE

## 2018-12-09 LAB — SARS CORONAVIRUS 2 BY RT PCR (HOSPITAL ORDER, PERFORMED IN ~~LOC~~ HOSPITAL LAB): SARS Coronavirus 2: NEGATIVE

## 2018-12-09 SURGERY — Surgical Case
Anesthesia: Epidural

## 2018-12-09 MED ORDER — ZOLPIDEM TARTRATE 5 MG PO TABS: 5.0000 mg | ORAL_TABLET | Freq: Every evening | ORAL | Status: DC | PRN

## 2018-12-09 MED ORDER — DIPHENHYDRAMINE HCL 50 MG/ML IJ SOLN: 12.5000 mg | INTRAMUSCULAR | Status: DC | PRN

## 2018-12-09 MED ORDER — IBUPROFEN 600 MG PO TABS
600.0000 mg | ORAL_TABLET | Freq: Four times a day (QID) | ORAL | Status: DC
  Administered 2018-12-10 – 2018-12-11 (×4): 600 mg via ORAL
  Filled 2018-12-09 (×4): qty 1

## 2018-12-09 MED ORDER — FENTANYL CITRATE (PF) 100 MCG/2ML IJ SOLN: 25.0000 ug | INTRAMUSCULAR | Status: DC | PRN

## 2018-12-09 MED ORDER — PHENYLEPHRINE 40 MCG/ML (10ML) SYRINGE FOR IV PUSH (FOR BLOOD PRESSURE SUPPORT): 80.0000 ug | PREFILLED_SYRINGE | INTRAVENOUS | Status: DC | PRN

## 2018-12-09 MED ORDER — OXYTOCIN 40 UNITS IN NORMAL SALINE INFUSION - SIMPLE MED
1.0000 m[IU]/min | INTRAVENOUS | Status: DC
  Administered 2018-12-09: 4 m[IU]/min via INTRAVENOUS
  Filled 2018-12-09: qty 1000

## 2018-12-09 MED ORDER — SENNOSIDES-DOCUSATE SODIUM 8.6-50 MG PO TABS
2.0000 | ORAL_TABLET | ORAL | Status: DC
  Administered 2018-12-09 – 2018-12-11 (×2): 2 via ORAL
  Filled 2018-12-09 (×2): qty 2

## 2018-12-09 MED ORDER — ENOXAPARIN SODIUM 40 MG/0.4ML ~~LOC~~ SOLN
40.0000 mg | SUBCUTANEOUS | Status: DC
  Administered 2018-12-10 – 2018-12-11 (×2): 40 mg via SUBCUTANEOUS
  Filled 2018-12-09 (×3): qty 0.4

## 2018-12-09 MED ORDER — OXYCODONE HCL 5 MG PO TABS: 5.0000 mg | ORAL_TABLET | Freq: Once | ORAL | Status: DC | PRN

## 2018-12-09 MED ORDER — LACTATED RINGERS IV SOLN: 500.0000 mL | Freq: Once | INTRAVENOUS | Status: DC

## 2018-12-09 MED ORDER — BUPIVACAINE LIPOSOME 1.3 % IJ SUSP
INTRAMUSCULAR | Status: DC | PRN
  Administered 2018-12-09: 20 mL

## 2018-12-09 MED ORDER — SOD CITRATE-CITRIC ACID 500-334 MG/5ML PO SOLN
30.0000 mL | ORAL | Status: DC | PRN
  Filled 2018-12-09: qty 30

## 2018-12-09 MED ORDER — CEFAZOLIN SODIUM-DEXTROSE 2-3 GM-%(50ML) IV SOLR
INTRAVENOUS | Status: DC | PRN
  Administered 2018-12-09: 2 g via INTRAVENOUS

## 2018-12-09 MED ORDER — FENTANYL CITRATE (PF) 100 MCG/2ML IJ SOLN
INTRAMUSCULAR | Status: AC
  Filled 2018-12-09: qty 2

## 2018-12-09 MED ORDER — OXYTOCIN 40 UNITS IN NORMAL SALINE INFUSION - SIMPLE MED
2.5000 [IU]/h | INTRAVENOUS | Status: DC
  Filled 2018-12-09: qty 1000

## 2018-12-09 MED ORDER — LACTATED RINGERS IV SOLN: INTRAVENOUS | Status: DC

## 2018-12-09 MED ORDER — SOD CITRATE-CITRIC ACID 500-334 MG/5ML PO SOLN
30.0000 mL | ORAL | Status: AC
  Administered 2018-12-09: 30 mL via ORAL

## 2018-12-09 MED ORDER — EPHEDRINE 5 MG/ML INJ: 10.0000 mg | INTRAVENOUS | Status: DC | PRN

## 2018-12-09 MED ORDER — GABAPENTIN 300 MG PO CAPS
300.0000 mg | ORAL_CAPSULE | Freq: Every day | ORAL | Status: DC
  Administered 2018-12-09 – 2018-12-10 (×2): 300 mg via ORAL
  Filled 2018-12-09 (×2): qty 1

## 2018-12-09 MED ORDER — OXYTOCIN 40 UNITS IN NORMAL SALINE INFUSION - SIMPLE MED
2.5000 [IU]/h | INTRAVENOUS | Status: AC
  Filled 2018-12-09: qty 1000

## 2018-12-09 MED ORDER — KETOROLAC TROMETHAMINE 30 MG/ML IJ SOLN
30.0000 mg | Freq: Four times a day (QID) | INTRAMUSCULAR | Status: AC
  Administered 2018-12-09 – 2018-12-10 (×3): 30 mg via INTRAVENOUS
  Filled 2018-12-09 (×3): qty 1

## 2018-12-09 MED ORDER — LIDOCAINE HCL (PF) 2 % IJ SOLN
INTRAMUSCULAR | Status: AC
  Filled 2018-12-09: qty 40

## 2018-12-09 MED ORDER — OXYTOCIN BOLUS FROM INFUSION: 500.0000 mL | Freq: Once | INTRAVENOUS | Status: DC

## 2018-12-09 MED ORDER — PRENATAL MULTIVITAMIN CH
1.0000 | ORAL_TABLET | Freq: Every day | ORAL | Status: DC
  Administered 2018-12-10: 1 via ORAL
  Filled 2018-12-09 (×2): qty 1

## 2018-12-09 MED ORDER — COCONUT OIL OIL
1.0000 "application " | TOPICAL_OIL | Status: DC | PRN
  Administered 2018-12-09: 1 via TOPICAL
  Filled 2018-12-09: qty 120

## 2018-12-09 MED ORDER — KETOROLAC TROMETHAMINE 30 MG/ML IJ SOLN
INTRAMUSCULAR | Status: AC
  Filled 2018-12-09: qty 1

## 2018-12-09 MED ORDER — SODIUM CHLORIDE 0.9% FLUSH
INTRAVENOUS | Status: DC | PRN
  Administered 2018-12-09: 50 mL

## 2018-12-09 MED ORDER — KETOROLAC TROMETHAMINE 30 MG/ML IJ SOLN
30.0000 mg | Freq: Once | INTRAMUSCULAR | Status: AC
  Administered 2018-12-09: 30 mg via INTRAVENOUS

## 2018-12-09 MED ORDER — BUTORPHANOL TARTRATE 1 MG/ML IJ SOLN
1.0000 mg | INTRAMUSCULAR | Status: DC | PRN
  Administered 2018-12-09: 1 mg via INTRAVENOUS
  Filled 2018-12-09: qty 1

## 2018-12-09 MED ORDER — ACETAMINOPHEN 500 MG PO TABS: 1000.0000 mg | ORAL_TABLET | Freq: Four times a day (QID) | ORAL | Status: DC | PRN

## 2018-12-09 MED ORDER — SODIUM CHLORIDE 0.9 % IV SOLN
500.0000 mg | INTRAVENOUS | Status: AC
  Administered 2018-12-09: 500 mg via INTRAVENOUS
  Filled 2018-12-09: qty 500

## 2018-12-09 MED ORDER — TERBUTALINE SULFATE 1 MG/ML IJ SOLN: 0.2500 mg | Freq: Once | INTRAMUSCULAR | Status: DC | PRN

## 2018-12-09 MED ORDER — ONDANSETRON HCL 4 MG/2ML IJ SOLN
INTRAMUSCULAR | Status: DC | PRN
  Administered 2018-12-09: 4 mg via INTRAVENOUS

## 2018-12-09 MED ORDER — CEFAZOLIN SODIUM-DEXTROSE 2-4 GM/100ML-% IV SOLN
2.0000 g | Freq: Once | INTRAVENOUS | Status: DC
  Filled 2018-12-09: qty 100

## 2018-12-09 MED ORDER — FENTANYL 2.5 MCG/ML W/ROPIVACAINE 0.15% IN NS 100 ML EPIDURAL (ARMC)
EPIDURAL | Status: AC
  Filled 2018-12-09: qty 100

## 2018-12-09 MED ORDER — AMMONIA AROMATIC IN INHA
RESPIRATORY_TRACT | Status: AC
  Filled 2018-12-09: qty 10

## 2018-12-09 MED ORDER — BUPIVACAINE HCL (PF) 0.5 % IJ SOLN
INTRAMUSCULAR | Status: DC | PRN
  Administered 2018-12-09: 30 mL

## 2018-12-09 MED ORDER — LACTATED RINGERS IV SOLN
INTRAVENOUS | Status: DC
  Administered 2018-12-09 (×2): via INTRAVENOUS

## 2018-12-09 MED ORDER — LIDOCAINE HCL (PF) 1 % IJ SOLN
30.0000 mL | INTRAMUSCULAR | Status: AC | PRN
  Administered 2018-12-09: 1.2 mL via SUBCUTANEOUS

## 2018-12-09 MED ORDER — METHYLERGONOVINE MALEATE 0.2 MG/ML IJ SOLN
INTRAMUSCULAR | Status: DC | PRN
  Administered 2018-12-09: 0.2 mg via INTRAMUSCULAR

## 2018-12-09 MED ORDER — BUPIVACAINE LIPOSOME 1.3 % IJ SUSP: 20.0000 mL | Freq: Once | INTRAMUSCULAR | Status: DC

## 2018-12-09 MED ORDER — BUPIVACAINE LIPOSOME 1.3 % IJ SUSP
INTRAMUSCULAR | Status: AC
  Filled 2018-12-09: qty 20

## 2018-12-09 MED ORDER — OXYTOCIN 10 UNIT/ML IJ SOLN
INTRAMUSCULAR | Status: AC
  Filled 2018-12-09: qty 2

## 2018-12-09 MED ORDER — SIMETHICONE 80 MG PO CHEW
80.0000 mg | CHEWABLE_TABLET | Freq: Three times a day (TID) | ORAL | Status: DC
  Administered 2018-12-10 – 2018-12-11 (×5): 80 mg via ORAL
  Filled 2018-12-09 (×5): qty 1

## 2018-12-09 MED ORDER — ONDANSETRON HCL 4 MG/2ML IJ SOLN: 4.0000 mg | Freq: Four times a day (QID) | INTRAMUSCULAR | Status: DC | PRN

## 2018-12-09 MED ORDER — TETANUS-DIPHTH-ACELL PERTUSSIS 5-2.5-18.5 LF-MCG/0.5 IM SUSP
0.5000 mL | Freq: Once | INTRAMUSCULAR | Status: DC
  Filled 2018-12-09: qty 0.5

## 2018-12-09 MED ORDER — FENTANYL 2.5 MCG/ML W/ROPIVACAINE 0.15% IN NS 100 ML EPIDURAL (ARMC)
12.0000 mL/h | EPIDURAL | Status: DC
  Administered 2018-12-09: 12 mL/h via EPIDURAL
  Filled 2018-12-09: qty 100

## 2018-12-09 MED ORDER — LIDOCAINE 2% (20 MG/ML) 5 ML SYRINGE
INTRAMUSCULAR | Status: DC | PRN
  Administered 2018-12-09 (×4): 100 mg via INTRAVENOUS

## 2018-12-09 MED ORDER — DIBUCAINE (PERIANAL) 1 % EX OINT: 1.0000 "application " | TOPICAL_OINTMENT | CUTANEOUS | Status: DC | PRN

## 2018-12-09 MED ORDER — LIDOCAINE-EPINEPHRINE (PF) 1.5 %-1:200000 IJ SOLN
INTRAMUSCULAR | Status: DC | PRN
  Administered 2018-12-09: 3 mL via EPIDURAL

## 2018-12-09 MED ORDER — SODIUM CHLORIDE (PF) 0.9 % IJ SOLN
INTRAMUSCULAR | Status: AC
  Filled 2018-12-09: qty 50

## 2018-12-09 MED ORDER — FENTANYL CITRATE (PF) 100 MCG/2ML IJ SOLN
INTRAMUSCULAR | Status: DC | PRN
  Administered 2018-12-09: 100 ug via INTRAVENOUS

## 2018-12-09 MED ORDER — DIPHENHYDRAMINE HCL 25 MG PO CAPS: 25.0000 mg | ORAL_CAPSULE | Freq: Four times a day (QID) | ORAL | Status: DC | PRN

## 2018-12-09 MED ORDER — WITCH HAZEL-GLYCERIN EX PADS: 1.0000 "application " | MEDICATED_PAD | CUTANEOUS | Status: DC | PRN

## 2018-12-09 MED ORDER — FENTANYL 2.5 MCG/ML W/ROPIVACAINE 0.15% IN NS 100 ML EPIDURAL (ARMC)
EPIDURAL | Status: DC | PRN
  Administered 2018-12-09: 12 mL/h via EPIDURAL

## 2018-12-09 MED ORDER — LACTATED RINGERS IV SOLN: 500.0000 mL | INTRAVENOUS | Status: DC | PRN

## 2018-12-09 MED ORDER — OXYCODONE HCL 5 MG/5ML PO SOLN
5.0000 mg | Freq: Once | ORAL | Status: DC | PRN
  Filled 2018-12-09: qty 5

## 2018-12-09 MED ORDER — OXYCODONE HCL 5 MG PO TABS
5.0000 mg | ORAL_TABLET | ORAL | Status: DC | PRN
  Administered 2018-12-09 – 2018-12-11 (×5): 10 mg via ORAL
  Filled 2018-12-09 (×5): qty 2

## 2018-12-09 MED ORDER — SIMETHICONE 80 MG PO CHEW: 80.0000 mg | CHEWABLE_TABLET | ORAL | Status: DC | PRN

## 2018-12-09 MED ORDER — BUPIVACAINE HCL (PF) 0.5 % IJ SOLN
INTRAMUSCULAR | Status: AC
  Filled 2018-12-09: qty 30

## 2018-12-09 MED ORDER — ACETAMINOPHEN 500 MG PO TABS
ORAL_TABLET | ORAL | Status: AC
  Filled 2018-12-09: qty 2

## 2018-12-09 MED ORDER — LIDOCAINE HCL (PF) 1 % IJ SOLN
INTRAMUSCULAR | Status: AC
  Filled 2018-12-09: qty 30

## 2018-12-09 MED ORDER — MISOPROSTOL 200 MCG PO TABS
ORAL_TABLET | ORAL | Status: AC
  Filled 2018-12-09: qty 4

## 2018-12-09 MED ORDER — METHYLERGONOVINE MALEATE 0.2 MG/ML IJ SOLN
INTRAMUSCULAR | Status: AC
  Filled 2018-12-09: qty 1

## 2018-12-09 MED ORDER — SIMETHICONE 80 MG PO CHEW
80.0000 mg | CHEWABLE_TABLET | ORAL | Status: DC
  Administered 2018-12-09 – 2018-12-11 (×2): 80 mg via ORAL
  Filled 2018-12-09 (×2): qty 1

## 2018-12-09 MED ORDER — ACETAMINOPHEN 500 MG PO TABS
1000.0000 mg | ORAL_TABLET | Freq: Four times a day (QID) | ORAL | Status: DC
  Administered 2018-12-09 – 2018-12-11 (×6): 1000 mg via ORAL
  Filled 2018-12-09 (×3): qty 2

## 2018-12-09 MED ORDER — MENTHOL 3 MG MT LOZG
1.0000 | LOZENGE | OROMUCOSAL | Status: DC | PRN
  Filled 2018-12-09: qty 9

## 2018-12-09 SURGICAL SUPPLY — 32 items
APL PRP STRL LF DISP 70% ISPRP (MISCELLANEOUS) ×1
BARRIER ADHS 3X4 INTERCEED (GAUZE/BANDAGES/DRESSINGS) ×2 IMPLANT
BRR ADH 4X3 ABS CNTRL BYND (GAUZE/BANDAGES/DRESSINGS) ×1
CANISTER SUCT 3000ML PPV (MISCELLANEOUS) ×2 IMPLANT
CHLORAPREP W/TINT 26 (MISCELLANEOUS) ×2 IMPLANT
COVER WAND RF STERILE (DRAPES) ×2 IMPLANT
DRSG TELFA 3X8 NADH (GAUZE/BANDAGES/DRESSINGS) ×2 IMPLANT
ELECT CAUTERY BLADE 6.4 (BLADE) ×2 IMPLANT
ELECT REM PT RETURN 9FT ADLT (ELECTROSURGICAL) ×2
ELECTRODE REM PT RTRN 9FT ADLT (ELECTROSURGICAL) ×1 IMPLANT
GAUZE SPONGE 4X4 12PLY STRL (GAUZE/BANDAGES/DRESSINGS) ×2 IMPLANT
GLOVE BIO SURGEON STRL SZ8 (GLOVE) ×2 IMPLANT
GOWN STRL REUS W/ TWL LRG LVL3 (GOWN DISPOSABLE) ×2 IMPLANT
GOWN STRL REUS W/ TWL XL LVL3 (GOWN DISPOSABLE) ×1 IMPLANT
GOWN STRL REUS W/TWL LRG LVL3 (GOWN DISPOSABLE) ×4
GOWN STRL REUS W/TWL XL LVL3 (GOWN DISPOSABLE) ×2
NS IRRIG 1000ML POUR BTL (IV SOLUTION) ×2 IMPLANT
PACK C SECTION AR (MISCELLANEOUS) ×2 IMPLANT
PAD DRESSING TELFA 3X8 NADH (GAUZE/BANDAGES/DRESSINGS) ×1 IMPLANT
PAD OB MATERNITY 4.3X12.25 (PERSONAL CARE ITEMS) ×2 IMPLANT
PAD PREP 24X41 OB/GYN DISP (PERSONAL CARE ITEMS) ×2 IMPLANT
PENCIL SMOKE ULTRAEVAC 22 CON (MISCELLANEOUS) ×2 IMPLANT
RETRACTOR TRAXI PANNICULUS (MISCELLANEOUS) IMPLANT
SPONGE LAP 18X18 RF (DISPOSABLE) ×2 IMPLANT
STAPLER INSORB 30 2030 C-SECTI (MISCELLANEOUS) ×1 IMPLANT
STRAP SAFETY 5IN WIDE (MISCELLANEOUS) ×2 IMPLANT
SUCT VACUUM KIWI BELL (SUCTIONS) IMPLANT
SUT CHROMIC 1 CTX 36 (SUTURE) ×6 IMPLANT
SUT PLAIN GUT 0 (SUTURE) ×4 IMPLANT
SUT VIC AB 0 CT1 36 (SUTURE) ×4 IMPLANT
TRAXI PANNICULUS RETRACTOR (MISCELLANEOUS)
TUBE CONNECTING 6X3/16 (MISCELLANEOUS) ×1 IMPLANT

## 2018-12-09 NOTE — Progress Notes (Signed)
Labor Progress Note  Jacqueline Mendez is a 28 y.o. G1P0000 at [redacted]w[redacted]d by LMP admitted for labor.  Subjective:  Comfortable with epidural.   Objective: BP 120/79   Pulse 87   Temp 97.8 F (36.6 C) (Oral)   Resp 16   Ht 5\' 4"  (1.626 m)   Wt 88.5 kg   LMP 02/11/2018 (Exact Date)   SpO2 99%   BMI 33.47 kg/m   Fetal Assessment: FHT:  FHR: 125 bpm, variability: moderate,  accelerations:  Present,  decelerations:  Present variable Category/reactivity:  Category II UC:   regular, every 2-5 minutes SVE:    Dilation: 6cm  Effacement: 80%  Station:  -1  Consistency: medium  Position: middle  Membrane status: AROM 0315 Amniotic color: clear  Labs: Lab Results  Component Value Date   WBC 10.2 12/09/2018   HGB 13.7 12/09/2018   HCT 40.0 12/09/2018   MCV 92.4 12/09/2018   PLT 160 12/09/2018    Assessment / Plan: Augmentation of labor  Labor: s/p AROM; pitocin started at 0539, currently infusing at 11mu/min; IUPC placed now to monitor for adequacy  Fetal Wellbeing:  Category II, overall reassuring Pain Control:  Epidural Anticipated MOD:  NSVD  Lisette Grinder, CNM 12/09/2018, 10:34 AM

## 2018-12-09 NOTE — Progress Notes (Signed)
Labor Progress Note  Jacqueline Mendez is a 28 y.o. G1P0000 at [redacted]w[redacted]d by LMP admitted for labor.  Subjective:  Comfortable with epidural.   Objective: BP 120/79   Pulse 87   Temp 98.4 F (36.9 C) (Oral)   Resp 16   Ht 5\' 4"  (1.626 m)   Wt 88.5 kg   LMP 02/11/2018 (Exact Date)   SpO2 99%   BMI 33.47 kg/m   Fetal Assessment: FHT:  FHR: 135 bpm, variability: moderate,  accelerations:  Present,  decelerations:  Present variable Category/reactivity:  Category II UC:   regular, every 2-4 SVE:   Dilation: 6cm  Effacement: 80%  Station:  0  Consistency: soft  Position: middle   Fetal position ROT  Membrane status: AROM 0315 Amniotic color: clear  Labs: Lab Results  Component Value Date   WBC 10.2 12/09/2018   HGB 13.7 12/09/2018   HCT 40.0 12/09/2018   MCV 92.4 12/09/2018   PLT 160 12/09/2018    Assessment / Plan: Augmentation of labor  Labor: s/p AROM; pitocin started at 0539, currently infusing at 29mu/min; MVUs >200 for two hours   Fetal Wellbeing:  Category II, overall reassuring Pain Control:  Epidural Anticipated MOD:  NSVD    Lisette Grinder, CNM 12/09/2018, 1:56 PM

## 2018-12-09 NOTE — H&P (Signed)
OB History & Physical   History of Present Illness:  Chief Complaint:   HPI:  Jacqueline Mendez is a 28 y.o. G1P0000 female at [redacted]w[redacted]d dated by Patient's last menstrual period was 02/11/2018 (exact date). Estimated Date of Delivery: 12/10/18  She presents to L&D with persistent and increasingly frequent and intense contractions.  Was seen in office today and 3cm, 50%, -3.  +FM, + CTX, no LOF, no VB  Pregnancy Issues: 1. Hx HSV, on valtrex 2. Pregnancy with ART (letrozole) 3. History of anxiety and depression   Maternal Medical History:   Past Medical History:  Diagnosis Date  . Herpes simplex virus (HSV) infection   . Medical history non-contributory   . Trichotillomania    2011    Past Surgical History:  Procedure Laterality Date  . arm surgery Bilateral    broken arm x4   . BREAST ENHANCEMENT SURGERY      Allergies  Allergen Reactions  . Codeine   . Latex     Prior to Admission medications   Medication Sig Start Date End Date Taking? Authorizing Provider  famotidine (PEPCID) 20 MG tablet Take 20 mg by mouth 2 (two) times daily.   Yes [provider]  Prenatal Vit-Fe Fumarate-FA (MULTIVITAMIN-PRENATAL) 27-0.8 MG TABS tablet Take 1 tablet by mouth daily at 12 noon.   Yes [provider]  valACYclovir (VALTREX) 1000 MG tablet TAKE 1 TABLET BY MOUTH EVERY DAY AS NEEDED 05/05/17  Yes Nilda Simmer, NP     Prenatal care site: [place]    Social History: She  reports that she has never smoked. She has never used smokeless tobacco. She reports current alcohol use. She reports that she does not use drugs.  Family History: family history includes Alzheimer's disease in her maternal grandfather; Cancer in her maternal grandfather; Cancer (age of onset: 25) in her maternal grandmother; Heart attack in her paternal grandfather; Hyperlipidemia in her father; Hypertension in her father.   Review of Systems: A full review of systems was  performed and negative except as noted in the HPI.     Physical Exam:  Vital Signs: BP 127/82 (BP Location: Left Arm)   Pulse 83   Temp 98.1 F (36.7 C) (Oral)   Resp 16   Ht 5\' 4"  (1.626 m)   Wt 88.5 kg   LMP 02/11/2018 (Exact Date)   SpO2 97%   BMI 33.47 kg/m  General: no acute distress.  HEENT: normocephalic, atraumatic Heart: regular rate & rhythm.  No murmurs/rubs/gallops Lungs: clear to auscultation bilaterally, normal respiratory effort Abdomen: soft, gravid, non-tender;  EFW: 8lb 5oz Pelvic:   External: Normal external female genitalia  SVE: no lesions   Extremities: non-tender, symmetric, 1+ edema bilaterally.  DTRs: 2+ Neurologic: Alert & oriented x 3.    Results for orders placed or performed during the hospital encounter of 12/08/18 (from the past 24 hour(s))  SARS Coronavirus 2 by RT PCR (hospital order, performed in [facility] hospital lab) Nasopharyngeal Nasopharyngeal Swab     Status: None   Collection Time: 12/09/18 12:50 AM   Specimen: Nasopharyngeal Swab  Result Value Ref Range   SARS Coronavirus 2 NEGATIVE NEGATIVE  Type and screen [place]     Status: None   Collection Time: 12/09/18  1:19 AM  Result Value Ref Range   ABO/RH(D) A POS    Antibody Screen NEG    Sample Expiration      12/12/2018,2359 Performed at [facility]  Lab, [address] [place], [place] [address]   CBC with Differential/Platelet     Status: Abnormal   Collection Time: 12/09/18  1:19 AM  Result Value Ref Range   WBC 10.2 4.0 - 10.5 K/uL   RBC 4.33 3.87 - 5.11 MIL/uL   Hemoglobin 13.7 12.0 - 15.0 g/dL   HCT 54.6 27.0 - 35.0 %   MCV 92.4 80.0 - 100.0 fL   MCH 31.6 26.0 - 34.0 pg   MCHC 34.3 30.0 - 36.0 g/dL   RDW 09.3 81.8 - 29.9 %   Platelets 160 150 - 400 K/uL   nRBC 0.0 0.0 - 0.2 %   Neutrophils Relative % 76 %   Neutro Abs 7.8 (H) 1.7 - 7.7 K/uL   Lymphocytes Relative 15 %   Lymphs Abs 1.5 0.7 - 4.0 K/uL   Monocytes Relative 7 %    Monocytes Absolute 0.7 0.1 - 1.0 K/uL   Eosinophils Relative 1 %   Eosinophils Absolute 0.1 0.0 - 0.5 K/uL   Basophils Relative 0 %   Basophils Absolute 0.0 0.0 - 0.1 K/uL   Immature Granulocytes 1 %   Abs Immature Granulocytes 0.05 0.00 - 0.07 K/uL    Pertinent Results:  Prenatal Labs: Blood type/Rh A positive  Antibody screen neg  Rubella Immune  Varicella Immune  RPR NR  HBsAg Neg  HIV NR  GC neg  Chlamydia neg  Genetic screening declined  1 hour GTT 94  3 hour GTT --  GBS negative   FHT: q2-50min TOCO: 125 mod + accels no decels SVE:  Dilation: 4 / Effacement (%): 70 / Station: -2    Cephalic by leopolds   Assessment:  Jacqueline Mendez is a 29 y.o. G1P0000 female at [redacted]w[redacted]d with labor.   Plan:  1. Admit to Labor & Delivery 2. CBC, T&S, Clrs, IVF 3. GBS negative   4. Consents obtained. 5. Continuous efm/toco 6. Covid testing 7. Category 1 8. Pain control - epidural when desired 9. Labor:  Will AROM when comfortable   ----- Ranae Plumber, MD Attending Obstetrician and Gynecologist [facility], Department of OB/GYN [facility]

## 2018-12-09 NOTE — Progress Notes (Signed)
Intrapartum progress note  S: contractions getting stronger  O: vitals WNL FHT 125 mod + accels no decels TOCO: q2-4 mins SVE: 4/70/-2, AROM for clear fluid  A/P: 28yo G1p0 @ 39+6 wit labor  1. IUP: category 1 2. Labor: s/p AROM, continue expectant management 3. Epidural upon request.  ----- Larey Days, MD, [place] Attending Obstetrician and Gynecologist [facility], Department of [facility]

## 2018-12-09 NOTE — Anesthesia Preprocedure Evaluation (Addendum)
Anesthesia Evaluation  Patient identified by MRN, date of birth, ID band Patient awake    Reviewed: Allergy & Precautions, H&P , NPO status , Patient's Chart, lab work & pertinent test results  History of Anesthesia Complications Negative for: history of anesthetic complications  Airway Mallampati: II  TM Distance: >3 FB Neck ROM: full    Dental  (+) Chipped   Pulmonary neg pulmonary ROS, neg shortness of breath,           Cardiovascular Exercise Tolerance: Good (-) hypertension(-) angina(-) Past MI negative cardio ROS       Neuro/Psych PSYCHIATRIC DISORDERS Anxiety negative neurological ROS     GI/Hepatic negative GI ROS, Neg liver ROS, neg GERD  ,  Endo/Other  negative endocrine ROS  Renal/GU negative Renal ROS  negative genitourinary   Musculoskeletal   Abdominal   Peds  Hematology negative hematology ROS (+)   Anesthesia Other Findings Past Medical History: No date: Herpes simplex virus (HSV) infection No date: Medical history non-contributory No date: Trichotillomania     Comment:  2011  Past Surgical History: No date: arm surgery; Bilateral     Comment:  broken arm x4  No date: BREAST ENHANCEMENT SURGERY  BMI    Body Mass Index: 33.47 kg/m      Reproductive/Obstetrics (+) Pregnancy                            Anesthesia Physical Anesthesia Plan  ASA: II  Anesthesia Plan: Epidural   Post-op Pain Management:    Induction:   PONV Risk Score and Plan:   Airway Management Planned:   Additional Equipment:   Intra-op Plan:   Post-operative Plan:   Informed Consent: I have reviewed the patients History and Physical, chart, labs and discussed the procedure including the risks, benefits and alternatives for the proposed anesthesia with the patient or authorized representative who has indicated his/her understanding and acceptance.       Plan Discussed with:  Anesthesiologist  Anesthesia Plan Comments:        Anesthesia Quick Evaluation

## 2018-12-09 NOTE — Progress Notes (Signed)
Patient has been in hands and knees, exaggerated right side lying, and now exaggerated left side lying. Cervix with no change from prior exam. No further descent and baby with increased caput and some moulding. Dr. Ouida Sills updated. Decision to proceed with cesarean delivery. Pitocin discontinued. Pre-op antibiotics ordered. OR and anesthesia made aware.   Lisette Grinder, CNM 12/09/2018 3:22 PM

## 2018-12-09 NOTE — Progress Notes (Signed)
   12/09/18 1000  Clinical Encounter Type  Visited With Patient and family together  Visit Type Initial  Referral From Nurse  Ch received an OR for AD. Education provided and document delivered. PT wants to make her spouse her 19.

## 2018-12-09 NOTE — Progress Notes (Signed)
Patient on 20 milliunits/hr of pitocin. RN given verbal order to continue to increase pitocin by 4 milliunits every 30 minutes until the patient reaches an adequate contraction pattern or up to 40 milliunits/hr of pitocin.

## 2018-12-09 NOTE — Brief Op Note (Signed)
12/09/2018  4:49 PM  PATIENT:  Jacqueline Mendez  28 y.o. female  PRE-OPERATIVE DIAGNOSIS:  cesarean section-failure to progress  POST-OPERATIVE DIAGNOSIS:  cesarean section-failure to progress cephalopelvic disproportion  PROCEDURE:  Procedure(s): CESAREAN SECTION (N/A) LTCS  SURGEON:  Surgeon(s) and Role:    * Vershawn Westrup, Gwen Her, MD - Primary  PHYSICIAN ASSISTANT: M haviland , CNM   ASSISTANTS: none   ANESTHESIA:   epidural  EBL:  EBL + 700 cc IOF 1000cc , uo 100 cc  BLOOD ADMINISTERED:none  DRAINS: Urinary Catheter (Foley)   LOCAL MEDICATIONS USED:  MARCAINE    and BUPIVICAINE   SPECIMEN:  No Specimen  DISPOSITION OF SPECIMEN:  N/A  COUNTS:  YES  TOURNIQUET:  * No tourniquets in log *  DICTATION: .Other Dictation: Dictation Number verbal   PLAN OF CARE: Admit to inpatient   PATIENT DISPOSITION:  PACU - hemodynamically stable.   Delay start of Pharmacological VTE agent (>24hrs) due to surgical blood loss or risk of bleeding: not applicable

## 2018-12-09 NOTE — Transfer of Care (Signed)
Immediate Anesthesia Transfer of Care Note  Patient: Jacqueline Mendez  Procedure(s) Performed: CESAREAN SECTION (N/A )  Patient Location: PACU and Mother/Baby  Anesthesia Type:Epidural  Level of Consciousness: awake, alert  and oriented  Airway & Oxygen Therapy: Patient Spontanous Breathing  Post-op Assessment: Report given to RN and Post -op Vital signs reviewed and stable  Post vital signs: Reviewed and stable  Last Vitals:  Vitals Value Taken Time  BP 124/91 12/09/18 1716  Temp    Pulse 100 12/09/18 1721  Resp 20 12/09/18 1721  SpO2 99 % 12/09/18 1721  Vitals shown include unvalidated device data.  Last Pain:  Vitals:   12/09/18 1715  TempSrc:   PainSc: 8       Patients Stated Pain Goal: 0 (04/88/89 1694)  Complications: No apparent anesthesia complications

## 2018-12-09 NOTE — Progress Notes (Signed)
Labor Progress Note  Jacqueline Mendez is a 28 y.o. G1P0000 at [redacted]w[redacted]d by LMP admitted for labor.  Subjective:  Comfortable with epidural.   Objective: BP 120/79   Pulse 87   Temp 98.4 F (36.9 C) (Oral)   Resp 16   Ht 5\' 4"  (1.626 m)   Wt 88.5 kg   LMP 02/11/2018 (Exact Date)   SpO2 99%   BMI 33.47 kg/m   Fetal Assessment: FHT:  FHR: 125 bpm, variability: moderate,  accelerations:  Present,  decelerations:  Present variable Category/reactivity:  Category II UC:   regular, every 2-4 SVE:   Per RN Orpha Bur at 1220 Dilation: 6cm  Effacement: 80%  Station:  -1  Consistency: medium  Position: middle  Membrane status: AROM 0315 Amniotic color: clear  Labs: Lab Results  Component Value Date   WBC 10.2 12/09/2018   HGB 13.7 12/09/2018   HCT 40.0 12/09/2018   MCV 92.4 12/09/2018   PLT 160 12/09/2018    Assessment / Plan: Augmentation of labor  Labor: s/p AROM; pitocin started at 0539, currently infusing at 46mu/min;MVUs >200 for two hours with no cervical change; trying exaggerated right side lying position now, plan to recheck cervix in 30 minutes Fetal Wellbeing:  Category II, overall reassuring Pain Control:  Epidural Anticipated MOD:  NSVD   Discussed possibility of recommendation for cesarean delivery with patient and her husband, based on adequate strength contractions without cervical change for at least 2 hours. Reviewed that we will attempt position changes, but if these do not facilitate cervical change, cesarean delivery would be a reasonable option.   Lisette Grinder, CNM 12/09/2018, 1:07 PM

## 2018-12-09 NOTE — Discharge Summary (Signed)
Obstetrical Discharge Summary  Patient Name: Jacqueline Mendez DOB: [birth year] MRN: [id number]  Date of Admission: 12/08/2018 Date of Delivery: 12/09/2018 Delivered by: Beverly Gust MD Date of Discharge: 12/11/18 Primary OB: [facility]  ESP:[id number]'A last menstrual period was 02/11/2018 (exact date). EDC Estimated Date of Delivery: 12/10/18 Gestational Age at Delivery: [redacted]w[redacted]d   Antepartum complications: 1. Hx HSV, on valtrex 2. Pregnancy with ART (letrozole) 3. History of anxiety and depression  Admitting Diagnosis:  Secondary Diagnosis: Patient Active Problem List   Diagnosis Date Noted  . Labor and delivery indication for care or intervention 12/09/2018  . Postoperative state 12/09/2018  . Elevated blood pressure affecting pregnancy in third trimester, antepartum 12/06/2018  . Vaginal discharge during pregnancy in third trimester 10/29/2018  . Anxiety 09/10/2015  . Insertion of Nexplanon 08/06/2014  . ADD (attention deficit disorder) 03/05/2013  . Trichotillomania 11/15/2012  . Obsessive compulsive disorder 09/07/2012    Augmentation: AROM and Pitocin Complications: none  Intrapartum complications/course: Active phase arrest , CPD  Date of Delivery: 12/11/2018  Delivered By: Beverly Gust MD Delivery Type: primary cesarean section, low transverse incision Anesthesia: epidural, surgical dosing Placenta: spontaneous Laceration: none Episiotomy: none Newborn Data: Live born female "Logan" Birth Weight: 8 lb 13.5 oz (4010 g) APGAR: 8, 9  Newborn Delivery   Birth date/time: 12/09/2018 16:07:00 Delivery type: C-Section, Low Transverse C-section categorization: Primary     Postpartum Procedures:   Post partum course:  Patient had an uncomplicated postpartum course.  By time of discharge on POD#2, her pain was controlled on oral pain medications; she had appropriate lochia and was ambulating, voiding without difficulty, tolerating regular diet and passing  flatus.   She was deemed stable for discharge to home.    Discharge Physical Exam:  BP 123/75 (BP Location: Right Arm)   Pulse 89   Temp 98 F (36.7 C) (Oral)   Resp 20   Ht 5\' 4"  (1.626 m)   Wt 88.5 kg   LMP 02/11/2018 (Exact Date)   SpO2 98%   Breastfeeding Unknown   BMI 33.47 kg/m   General: NAD CV: RRR Pulm: CTABL, nl effort ABD: s/nd/nt, fundus firm and below the umbilicus Lochia: moderate Incision: c/d/i DVT Evaluation: LE non-ttp, no evidence of DVT on exam.  Hemoglobin  Date Value Ref Range Status  12/11/2018 9.6 (L) 12.0 - 15.0 g/dL Final  12/13/2018 26/33/3545 11.1 - 15.9 g/dL Final   HCT  Date Value Ref Range Status  12/11/2018 29.3 (L) 36.0 - 46.0 % Final   Hematocrit  Date Value Ref Range Status  01/24/2018 43.4 34.0 - 46.6 % Final     Disposition: stable, discharge to home. Baby Feeding: breastmilk Baby Disposition: home with mom  Rh Immune globulin given:  Rubella vaccine given:  Tdap vaccine given in AP or PP setting:  Flu vaccine given in AP or PP setting:  Prenatal Labs:  Blood type/Rh A positive  Antibody screen neg  Rubella Immune  Varicella Immune  RPR NR  HBsAg Neg  HIV NR  GC neg  Chlamydia neg  Genetic screening declined  1 hour GTT 94  3 hour GTT --  GBS negative     Plan:  Jacqueline Mendez was discharged to home in good condition. Follow-up appointment with delivering provider in 2 weeks.  Discharge Medications: Allergies as of 12/11/2018      Reactions   Codeine    Latex       Medication List    TAKE these medications  acetaminophen 500 MG tablet Commonly known as: Acetaminophen Extra Strength Take 2 tablets (1,000 mg total) by mouth every 6 (six) hours for 3 days.   docusate sodium 100 MG capsule Commonly known as: Colace Take 1 capsule (100 mg total) by mouth 2 (two) times daily for 14 days. To keep stools soft, as needed   ferrous sulfate 325 (65 FE) MG tablet Take 1 tablet (325 mg total) by mouth daily  with breakfast. Take with Vitamin C   gabapentin 800 MG tablet Commonly known as: Neurontin Take 1 tablet (800 mg total) by mouth at bedtime for 14 days. Take nightly for 3 days, then up to 14 days as needed   ibuprofen 800 MG tablet Commonly known as: ADVIL Take 1 tablet (800 mg total) by mouth every 8 (eight) hours for 3 days. After 72hrs, may take PRN as needed   oxycodone 5 MG capsule Commonly known as: OXY-IR Take 1 capsule (5 mg total) by mouth every 6 (six) hours as needed for pain.       Follow-up Information    Schermerhorn, Gwen Her, MD Follow up in 2 week(s).   Specialty: Obstetrics and Gynecology Why: post op wound check  Contact information: [address] [place] [address] [phone]           Signed: Benjaman Kindler 12/11/18

## 2018-12-09 NOTE — Plan of Care (Signed)
Transferred to room 341. Oriented to room, Fall prevention and and Warden/ranger. Pt. V/O.

## 2018-12-09 NOTE — Anesthesia Post-op Follow-up Note (Signed)
Anesthesia QCDR form completed.        

## 2018-12-09 NOTE — Anesthesia Procedure Notes (Signed)
Epidural Patient location during procedure: OB Start time: 12/09/2018 4:23 AM End time: 12/09/2018 4:50 AM  Staffing Performed: anesthesiologist   Preanesthetic Checklist Completed: patient identified, site marked, surgical consent, pre-op evaluation, timeout performed, IV checked, risks and benefits discussed and monitors and equipment checked  Epidural Patient position: sitting Prep: Betadine Patient monitoring: heart rate, continuous pulse ox and blood pressure Approach: midline Location: L4-L5 Injection technique: LOR saline  Needle:  Needle type: Tuohy  Needle gauge: 17 G Needle length: 9 cm and 9 Needle insertion depth: 7 cm Catheter type: closed end flexible Catheter size: 20 Guage Catheter at skin depth: 11 cm Test dose: negative and 1.5% lidocaine with Epi 1:200 K  Assessment Events: blood not aspirated, injection not painful, no injection resistance, negative IV test and no paresthesia  Additional Notes   Patient tolerated the insertion well without complications.Reason for block:procedure for pain

## 2018-12-09 NOTE — Progress Notes (Signed)
Pt now 4+ hts at 6 cm with adequate ctx pattern and has not progressed past 6 cm .  Active phase arrest   I have expl;ained and counselled  the patient for suregery   risk explained . All questions answered . Proceed

## 2018-12-10 LAB — CBC
HCT: 31.5 % — ABNORMAL LOW (ref 36.0–46.0)
Hemoglobin: 11 g/dL — ABNORMAL LOW (ref 12.0–15.0)
MCH: 31.5 pg (ref 26.0–34.0)
MCHC: 34.9 g/dL (ref 30.0–36.0)
MCV: 90.3 fL (ref 80.0–100.0)
Platelets: 122 10*3/uL — ABNORMAL LOW (ref 150–400)
RBC: 3.49 MIL/uL — ABNORMAL LOW (ref 3.87–5.11)
RDW: 14 % (ref 11.5–15.5)
WBC: 11.4 10*3/uL — ABNORMAL HIGH (ref 4.0–10.5)
nRBC: 0 % (ref 0.0–0.2)

## 2018-12-10 NOTE — Anesthesia Postprocedure Evaluation (Signed)
Anesthesia Post Note  Patient: Jacqueline Mendez  Procedure(s) Performed: CESAREAN SECTION (N/A )  Patient location during evaluation: Mother Baby Anesthesia Type: Epidural Level of consciousness: awake and alert Pain management: pain level controlled Respiratory status: spontaneous breathing, nonlabored ventilation and respiratory function stable Cardiovascular status: stable Postop Assessment: no headache, no backache and epidural receding Anesthetic complications: no     Last Vitals:  Vitals:   12/10/18 0834 12/10/18 1129  BP: 110/67 92/63  Pulse: (!) 101 95  Resp: 18 18  Temp: 36.9 C 36.4 C  SpO2: 97% 99%    Last Pain:  Vitals:   12/10/18 1129  TempSrc: Oral  PainSc:                  Durenda Hurt

## 2018-12-10 NOTE — Op Note (Signed)
NAME: Jacqueline Mendez, Jacqueline Mendez MEDICAL RECORD WN:[id number] ACCOUNT [id number] DATE OF BIRTH:1990/11/26 FACILITY: [facility] LOCATION: [facility] PHYSICIAN:Younique Casad Cloyde Reams, MD  OPERATIVE REPORT  DATE OF PROCEDURE:  12/09/2018  PREOPERATIVE DIAGNOSIS:  Active phase arrest.  POSTOPERATIVE DIAGNOSES: 1.  Cephalopelvic disproportion. 2.  Active phase arrest.  PROCEDURE:  Primary low transverse cesarean section.  SURGEON:  Jennell Corner, MD  FIRST ASSISTANT:  Genia Del, CNM  ANESTHESIA:  Surgical dosing of continuous lumbar epidural.  INDICATIONS:  A 28 year old gravida 1, para 0 patient was laboring throughout the day and her cervix progressed to 6 cm, but did not progress past 6 cm despite adequate contraction pattern for 4 hours.  DESCRIPTION OF PROCEDURE:  After adequate surgical dosing of continuous lumbar epidural, the patient was placed in dorsal supine position, hip roll placed under the right side.  The patient did receive prophylactic antibiotics, 2 grams IV Ancef and 500  mg azithromycin.  The patient's abdomen was prepped and draped.  Timeout was performed.  A Pfannenstiel incision was made 2 fingerbreadths above the symphysis pubis.  Sharp dissection was used to identify the fascia.  Fascia was opened in the midline and  opened in a transverse fashion.  The superior aspect of the fascia was grasped with Kocher clamps and the recti muscles were dissected free.  The inferior aspect of the fascia was grasped with Kocher clamps and pyramidalis muscle was dissected free.   Entry into the peritoneal cavity was accomplished sharply.  The vesicouterine peritoneal fold was identified and a bladder flap was created and the bladder was reflected inferiorly.  Low transverse uterine incision was made.  Upon entry into the  endometrial cavity, clear fluid resulted.  An extremely wedged fetal head was then slowly brought up and delivered through the incision.  The infant's shoulders  and body were delivered without difficulty.  A large vigorous female was then dried on the  patient's abdomen for 60 seconds and was then passed to nursery staff who assigned Apgar scores of 8 and 9.  Time of birth 89 on 12/09/2018.  Fetal weight 4010 grams. The placenta was manually delivered and the uterus was exteriorized.  The endometrial  cavity was then wiped clean with laparotomy tape and intravenous Pitocin was administered.  Mild uterine atony was noted and the patient was then administered 0.2 mg intramuscular Methergine.  Uterine incision was closed with 1 chromic suture in a  running locking fashion.  Several additional figure-of-eight sutures were required for hemostasis.  Good hemostasis was noted.  Fallopian tubes and ovaries appeared normal.  Posterior cul-de-sac was irrigated and suctioned and the uterus was placed back  into the abdominal cavity. The paracolic gutters were wiped clean with laparotomy tape.  Good hemostasis was noted.  Interceed was placed over the uterine incision in a T-shaped fashion.  Fascia was then closed with 0 Vicryl suture in a running  nonlocking fashion.  Good approximation of edges.  Good hemostasis was noted.  The fascial edges were injected with a solution of 20 mL of 1.3% Exparel plus 30 mL of 0.5% Marcaine plus 50 mL of normal saline.  Approximately 60 mL of the solution was  injected in the fascial edges.  Subcutaneous tissues were then irrigated and bovied for hemostasis.  Given the depth of the subcutaneous tissues of 3 cm, the subcutaneous dead space was closed with a running 2-0 chromic suture.  Skin was reapproximated  with Insorb absorbable staples.  Good cosmetic effect.  The patient tolerated the procedure  well.  COMPLICATIONS:  There were no complications.  ESTIMATED BLOOD LOSS:  713 mL  INTRAOPERATIVE FLUIDS:  1 L  URINE OUTPUT:  100 mL  DISPOSITION:  The patient was taken to recovery room in good condition.  JN/NUANCE  D:12/09/2018  T:12/10/2018 JOB:[id number]

## 2018-12-10 NOTE — Progress Notes (Signed)
Post Op Day 1  Subjective: Doing well, no concerns. Ambulating without difficulty, pain managed with PO meds, tolerating regular diet, and voiding without difficulty.   No fever/chills, chest pain, shortness of breath, nausea/vomiting, or leg pain. No nipple or breast pain.  Objective: BP 107/66   Pulse (!) 105   Temp 98.1 F (36.7 C) (Oral)   Resp 20   Ht 5\' 4"  (1.626 m)   Wt 88.5 kg   LMP 02/11/2018 (Exact Date)   SpO2 98%   Breastfeeding Unknown   BMI 33.47 kg/m    Physical Exam:  General: alert, cooperative and appears stated age Breasts: filling CV: RRR Pulm: nl effort, CTABL Abdomen: soft, non-tender, active bowel sounds Uterine Fundus: firm Incision: dressing C/D/I Lochia: appropriate DVT Evaluation: No evidence of DVT seen on physical exam. No cords or calf tenderness. No significant calf/ankle edema.  Recent Labs    12/09/18 0119 12/10/18 0632  HGB 13.7 11.0*  HCT 40.0 31.5*  WBC 10.2 11.4*  PLT 160 122*    Assessment/Plan: 28 y.o. G1P1001 postop day # 1  -Continue routine postpartum care -Lactation consult PRN for breastfeeding  -Immunization status:  all immunizations up to date -Mild thrombocytopenia, repeat CBC in the AM  Disposition: Continue inpatient postpartum care    LOS: 1 day   Lisette Grinder, CNM 12/10/2018, 7:48 AM   ----- Lisette Grinder Certified Nurse Midwife [facility]

## 2018-12-11 ENCOUNTER — Encounter: Payer: Self-pay | Admitting: Obstetrics and Gynecology

## 2018-12-11 LAB — CBC
HCT: 29.3 % — ABNORMAL LOW (ref 36.0–46.0)
Hemoglobin: 9.6 g/dL — ABNORMAL LOW (ref 12.0–15.0)
MCH: 31.1 pg (ref 26.0–34.0)
MCHC: 32.8 g/dL (ref 30.0–36.0)
MCV: 94.8 fL (ref 80.0–100.0)
Platelets: 139 10*3/uL — ABNORMAL LOW (ref 150–400)
RBC: 3.09 MIL/uL — ABNORMAL LOW (ref 3.87–5.11)
RDW: 14.3 % (ref 11.5–15.5)
WBC: 7.9 10*3/uL (ref 4.0–10.5)
nRBC: 0 % (ref 0.0–0.2)

## 2018-12-11 MED ORDER — OXYCODONE HCL 5 MG PO CAPS: 5.0000 mg | ORAL_CAPSULE | Freq: Four times a day (QID) | ORAL | 0 refills | Status: DC | PRN

## 2018-12-11 MED ORDER — GABAPENTIN 800 MG PO TABS: 800.0000 mg | ORAL_TABLET | Freq: Every day | ORAL | 0 refills | Status: DC

## 2018-12-11 MED ORDER — FERROUS SULFATE 325 (65 FE) MG PO TABS
325.0000 mg | ORAL_TABLET | Freq: Two times a day (BID) | ORAL | Status: DC
  Administered 2018-12-11: 325 mg via ORAL
  Filled 2018-12-11: qty 1

## 2018-12-11 MED ORDER — ACETAMINOPHEN 500 MG PO TABS: 1000.0000 mg | ORAL_TABLET | Freq: Four times a day (QID) | ORAL | 0 refills | Status: AC

## 2018-12-11 MED ORDER — DOCUSATE SODIUM 100 MG PO CAPS: 100.0000 mg | ORAL_CAPSULE | Freq: Two times a day (BID) | ORAL | 0 refills | Status: AC

## 2018-12-11 MED ORDER — FERROUS SULFATE 325 (65 FE) MG PO TABS: 325.0000 mg | ORAL_TABLET | Freq: Every day | ORAL | 1 refills | Status: DC

## 2018-12-11 MED ORDER — IBUPROFEN 800 MG PO TABS: 800.0000 mg | ORAL_TABLET | Freq: Three times a day (TID) | ORAL | 1 refills | Status: AC

## 2018-12-11 NOTE — Discharge Instructions (Signed)
If there are any new medications, they have been ordered and will be available for pickup at the listed pharmacy on your way home from the hospital.  ° °Call office if you have any of the following: headache, visual changes, fever >101.0 F, chills, shortness of breath, breast concerns, excessive vaginal bleeding, incision drainage or problems, leg pain or redness, depression or any other concerns. If you have vaginal discharge with an odor, let your doctor know.  ° °It is normal to bleed for up to 6 weeks. You should not soak through more than 1 pad in 1 hour. If you have a blood clot larger than your fist with continued bleeding, call your doctor.  ° °Activity: Do not lift > 10 lbs for 6 weeks (do not lift anything heavier than your baby). °No intercourse, tampons, swimming pools, hot tubs, baths (only showers) for 6 weeks.  °No driving for 1-2 weeks. °Continue prenatal vitamin, especially if breastfeeding. °Increase calories and fluids (water) while breastfeeding.  ° °Your milk will come in, in the next couple of days (right now it is colostrum). You may have a slight fever when your milk comes in, but it should go away on its own.  If it does not, and rises above 101 F please call the doctor. You will also feel achy and your breasts will be firm. They will also start to leak. If you are breastfeeding, continue as you have been and you can pump/express milk for comfort.  ° °If you have too much milk, your breasts can become engorged, which could lead to mastitis. This is an infection of the milk ducts. It can be very painful and you will need to notify your doctor to obtain a prescription for antibiotics. You can also treat it with a shower or hot/cold compress.  ° °For concerns about your baby, please call your pediatrician.  °For breastfeeding concerns, the lactation consultant can be reached at [phone].  ° °Postpartum blues (feelings of happy one minute and sad another minute) are normal for the first few  weeks but if it gets worse let your doctor know.  ° °Congratulations! We enjoyed caring for you and your new bundle of joy!  °

## 2018-12-11 NOTE — Lactation Note (Signed)
This note was copied from a baby's chart. Lactation Consultation Note  Patient Name: Jacqueline Mendez Date: 12/11/2018 Reason for consult: Initial assessment  LC and [place] intern in to see mom and baby Rolla Plate before baby's circumcision. Mom has hx of breast augmentation, implants placed behind the muscle, underside of breast, and saline solution. Mom reports she is able to see milk expressed through hand expression, as well as in baby's mouth while feeding. Mom questions her breastfeeding positions, and needs reassurance. LC observed while mom brought baby to breast in cross cradle position position, attempting to latch baby. Baby was in good alignment, tummy was turned in, arm under breast, and other arm on top. Mom tried multiple times to elicit a wide open mouth for latch, but baby remained sleepy, uninterested, with wide open hands. LC reviewed breastfeeding basics, praised mom and dad for dedication to breastfeeding, and breastfeeding efforts thus far. Discussed impact circumcision may have on baby's behavior this afternoon for sleep and feeding patterns, and anticipation of another cluster feeding routine over night tonight. [facility] name and number written on whiteboard, encouraged to call out with any questions/concerns, or further assistance before being discharged home later today.  Maternal Data Formula Feeding for Exclusion: No Has patient been taught Hand Expression?: Yes Does the patient have breastfeeding experience prior to this delivery?: No  Feeding Feeding Type: Breast Fed  LATCH Score                   Interventions Interventions: Breast feeding basics reviewed  Lactation Tools Discussed/Used     Consult Status Consult Status: PRN Date: 12/11/18 Follow-up type: Call as needed    Lavonia Drafts 12/11/2018, 11:40 AM

## 2018-12-11 NOTE — Progress Notes (Signed)
Patient discharged home with infant. Discharge instructions and prescriptions given and reviewed with patient. Patient verbalized understanding. Escorted out by staff.  

## 2018-12-15 ENCOUNTER — Telehealth: Payer: Self-pay | Admitting: Lactation Services

## 2018-12-15 NOTE — Telephone Encounter (Signed)
Pt has been talking with Shaune Leeks, has an appt Tues Nov 24 for consult, problems with engorgement, day 7 now, just started back to breast today because of sore nipples, left breast softer now, right breast more firm, baby fights at this breast, pumps breast first x 20 min then breastfed 35 min. , is this ok?  Recommended just pre-pump approx 5 min before to soften, then try latching baby and then pump after if still full.  Try latching in different postion like side lying or on pillow in front and moving baby to breast gently and not pushing too hard. Experiment with different techniques.  Try cool packs or cool cabbage leaves to breast in between feedings or pumping inbetween feedings to soften breasts.  CAll as needed and will see for consult on Tues.

## 2018-12-17 ENCOUNTER — Encounter: Payer: Self-pay | Admitting: Family Medicine

## 2018-12-19 ENCOUNTER — Ambulatory Visit: Payer: Self-pay

## 2018-12-19 NOTE — Lactation Note (Signed)
This note was copied from a baby's chart. Lactation Consultation Note  Patient Name: Jacqueline Mendez DXAJO'I Date: 12/19/2018 Reason for consult: Other (Comment)(outpatient)  Mom scheduled outpatient lactation appointment due to breastfeeding difficulties. Difficulties noted with latch, nipple pain, milk transfer related to significant weight loss- 11oz. Weight has been regained to 2oz below birth weight, at 10 days of life. Mom initially was direct feeding, pumping between feeds, supplementing with expressed milk if any available, and/or 1-2oz of formula. Baby gained 6oz in 5 days. No supplementation has been used in the last 2 days, with baby content between feeds for 1.5 hours. Mom now reporting significant nipple pain and discomfort with latching and nipple appearing in the shape of a lipstick after feedings.  LC did an oral assessment of baby's mouth, suspecting an anterior tongue-tie that is restricting baby's tongue movement, creating painful latch. 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  Pre-feed of 8lb11.3oz Mom did attempt to latch several times, noting pain and discomfort, with baby's bottom lip curled under, with multiple attempts unsuccessful, LC did introduce a 44mm Nipple Shield. Mom was educated on how to place the nipple shield, use, and cleaning of the nipple shield. Baby was able to grasp onto the shield easily and sustain the latch, strong rhythmic sucking with spontaneous swallows heard throughout the 20 minute latch onto the left breast. Baby unlatched on his own, relaxed body language, post weight of 8lb11.7oz. Stimulated, and nipple shield placed on right breast. Again, baby easily able to grasp nipple/nipple shield, and begin strong rhythmic sucking with loud audible swallows. Baby fed on right  breast for additional 20 minutes with ending weight of 8lb12.6oz.  LC provided mom with referral information for 2 pediatric dentists in the area, for her to have someone to look to determine the severity of the tongue tie and see if baby would benefit from a release.  [place] instructed mom to continue with direct feeds at the breast on demand, followed by pumping post feeds to help promote adequate emptying, and protect milk supply. Mom plans to follow-up with Dr. Ronny Flurry for a consultation as soon as possible. Mom encouraged to call back in to [facility] department with any questions/concerns. LC provided praise and reassurance to mom for all of her breastfeeding efforts, and for providing her baby with breast milk. Mom feels relieved to have had a good feeding in office today, and feels confident in use of the nipple shield, continued coconut oil/expressed milk on nipples for healing, and in her plan to follow-up with a pediatric dentist for a second opinion.  Maternal Data Formula Feeding for Exclusion: No Has patient been taught Hand Expression?: Yes Does the patient have breastfeeding experience prior to this delivery?: No  Feeding Feeding Type: Breast Fed  LATCH Score Latch: Grasps breast easily, tongue down, lips flanged, rhythmical sucking.  Audible Swallowing: Spontaneous and intermittent  Type of Nipple: Everted at rest and after stimulation  Comfort (Breast/Nipple): Soft / non-tender  Hold (Positioning): No assistance needed to correctly position infant at breast.  LATCH Score: 10  Interventions Interventions: Breast feeding basics reviewed;Assisted with latch;Position options;DEBP  Lactation Tools Discussed/Used Tools: Nipple Shields Nipple shield size: 20   Consult Status Consult Status: PRN Date: 12/19/18 Follow-up type: Call as needed    Lavonia Drafts 12/19/2018, 4:59 PM

## 2018-12-25 DIAGNOSIS — Z3009 Encounter for other general counseling and advice on contraception: Secondary | ICD-10-CM | POA: Diagnosis not present

## 2018-12-25 DIAGNOSIS — Z9889 Other specified postprocedural states: Secondary | ICD-10-CM | POA: Diagnosis not present

## 2019-01-15 ENCOUNTER — Ambulatory Visit: Payer: Self-pay

## 2019-01-15 NOTE — Lactation Note (Signed)
This note was copied from a baby's chart. Lactation Consultation Note  Patient Name: Jacqueline Mendez [id number] Date: 01/15/2019   Mom called for a follow-up appointment after tongue-tie release in baby 3 weeks ago, and continuing to experience sore nipples. Mom requested today; 01/15/2019 appointment; unable to schedule in outpatient due to schedule being blocked. Mom and baby Rolla Plate arrived at 2:00pm. Mom provided information for the tongue tie release -Initial appointment with Dr. Ronny Flurry in [place] for evaluation and release on 12/25/2018 -Follow-up appointment with Dr. Ronny Flurry on 01/01/2019; everything healing well. -Mom has performed the instructed stretches 6x/day for 2 weeks, and 3x/day for the past 10+ days.   Mom feels that feedings have been improving, and she is now strictly breastfeeding, no longer pumping or bottle feeding, but continues to have sore nipples.  LC suggested a weighted feeding, to determine baby's transfer, and observe positioning and latch.  -Start weight: 4930g Mom opted to begin on the left breast, as she feels that this side is more painful. Baby was independently placed in cross cradle position by mom, and brought in nose to nipple. Baby opened wide, and latched easily. LC did note slight curling of the top lip, pulling it out, and adjusting bottom lip slightly; mom felt improvement immediately. Baby breastfed actively and well for 20 minutes before popping off.  -End weight on Left: 5004 Mom changed diaper, and switched to right breast, again independently placing baby in cross cradle hold, and placing nose to nipple. Baby latched easily and immediately began strong rhythmic sucking pattern, and nursed again for 15 minutes. -End weight on Right: 5040 Total transfer of approximately 3.66oz; appropriate for age.  Discussed with mom her self-care routine. Encouraged continued use of coconut oil, Vasolin or lanolin in the shower to help divert soap and water to  help prevent nipple and areola from drying out. Encouraged to continue working on ensuring top lip is flanged out, and deep latch each feeding. Praised mom for her continuation, perseverance, and dedication to breastfeeding. Provided encouragement through baby's need for larger clothes and diaper sizes, appearance, and timing/length between feeds of 2.5-3.5hours indicating satisfaction with feedings.  Mom will prepare for returning to work on 02/22/2019. Discussed reintegrating pumping back into routine slowly in 2 weeks-choosing 2 times to pump during the day post feeds, tips for reintroduction of bottle feeding and paced bottle feeding by caretakers. Reminded mom of availability of LC and outpatient services for first 12 months of baby's life; and provided community breastfeeding resources and support that is available. Encouraged mom to call if anything is needed.  Maternal Data    Feeding    LATCH Score                   Interventions    Lactation Tools Discussed/Used     Consult Status      Lavonia Drafts 01/15/2019, 3:41 PM

## 2019-01-17 DIAGNOSIS — Z30017 Encounter for initial prescription of implantable subdermal contraceptive: Secondary | ICD-10-CM | POA: Diagnosis not present

## 2019-01-17 DIAGNOSIS — Z3200 Encounter for pregnancy test, result unknown: Secondary | ICD-10-CM | POA: Diagnosis not present

## 2019-01-17 DIAGNOSIS — Z3009 Encounter for other general counseling and advice on contraception: Secondary | ICD-10-CM | POA: Diagnosis not present

## 2019-02-26 ENCOUNTER — Encounter: Payer: Self-pay | Admitting: Family Medicine

## 2019-03-27 IMAGING — RF DG HYSTEROGRAM
1 series · 4 of 4 positions shown · IV contrast (omnipaque)
Comparison: None.

CLINICAL DATA: Pro creation management investigation and testing.

EXAM:
HYSTEROSALPINGOGRAM
TECHNIQUE: Following cleansing of the cervix and vagina with Betadine solution,
a hysterosalpingogram was performed using a 5-French
hysterosalpingogram catheter and Omnipaque 300 contrast. The patient
tolerated the examination without difficulty.

[Series 1: fluoro_hsg_singleshot_bw · 0.17mm/px · 4 of 4 slices shown]
[im 1/4]
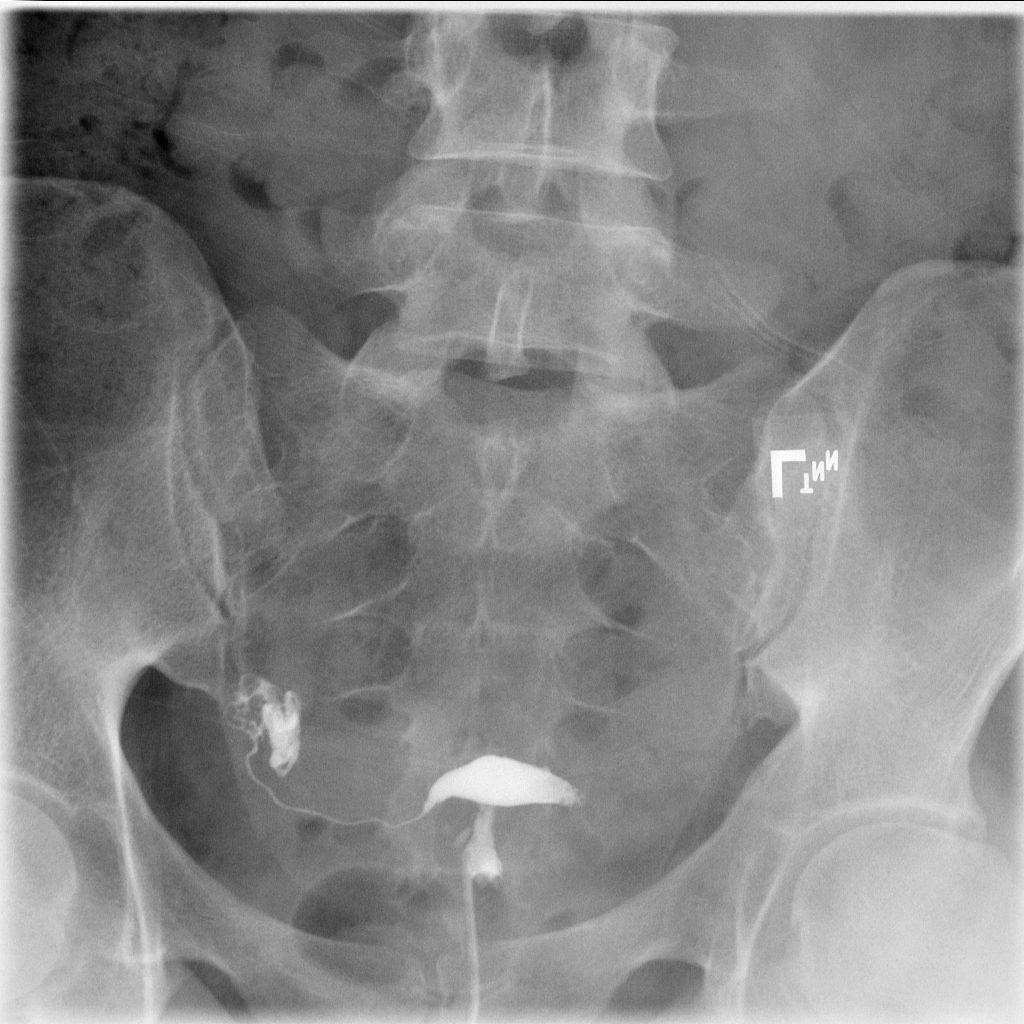
[im 2/4]
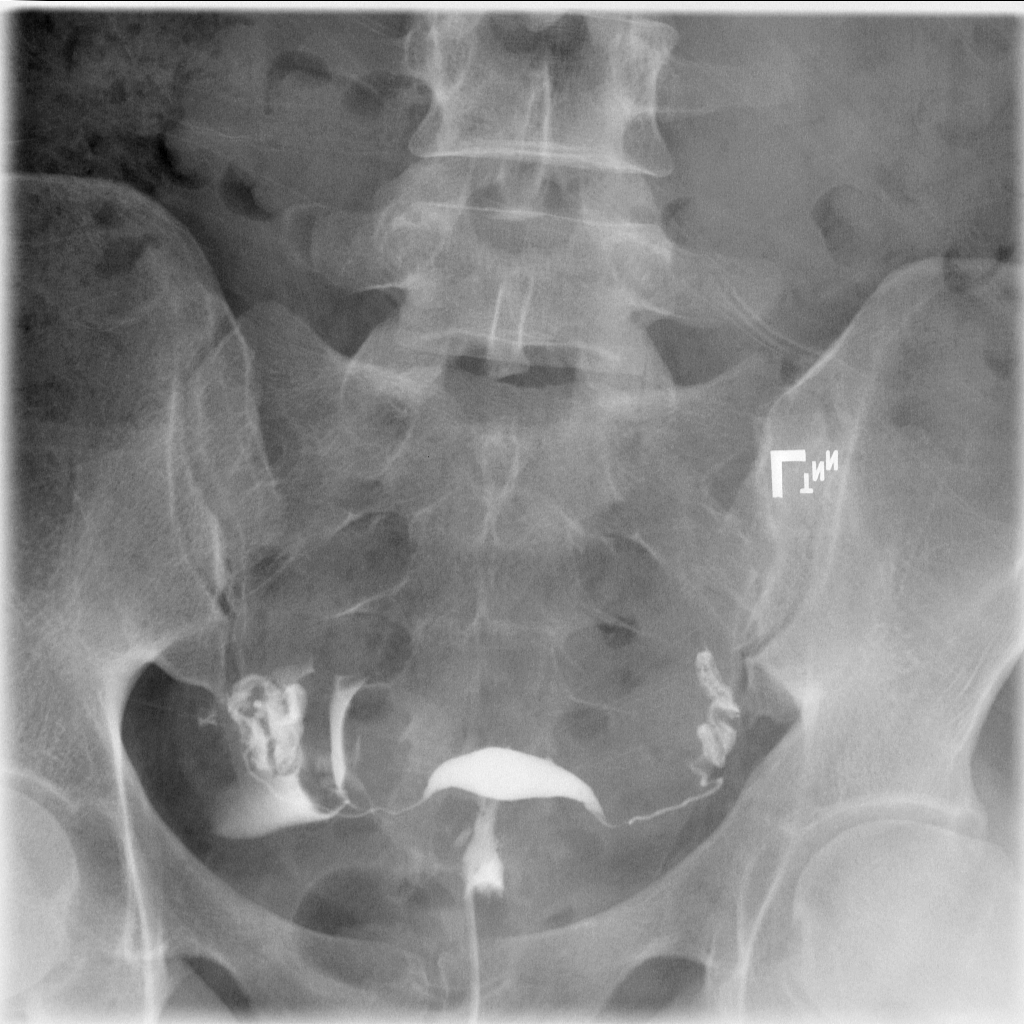
[im 3/4]
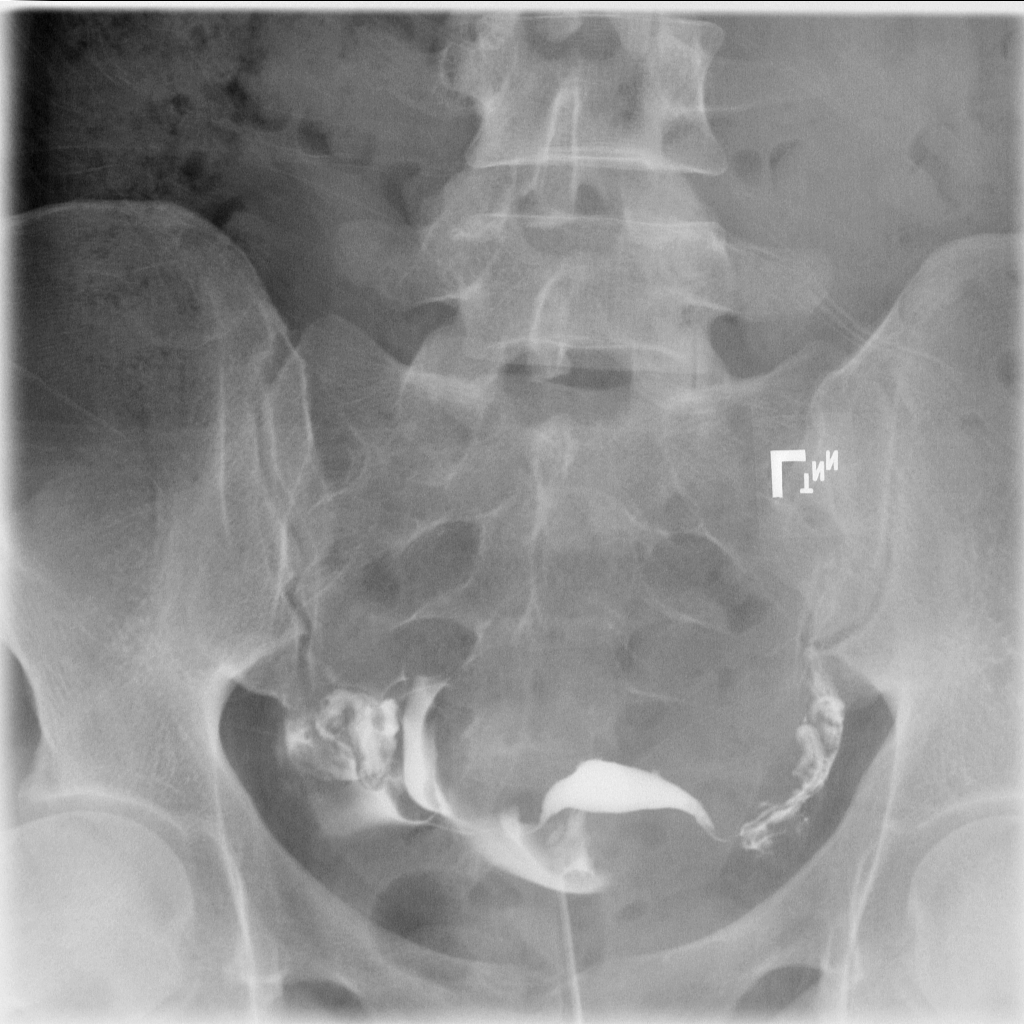
[im 4/4]
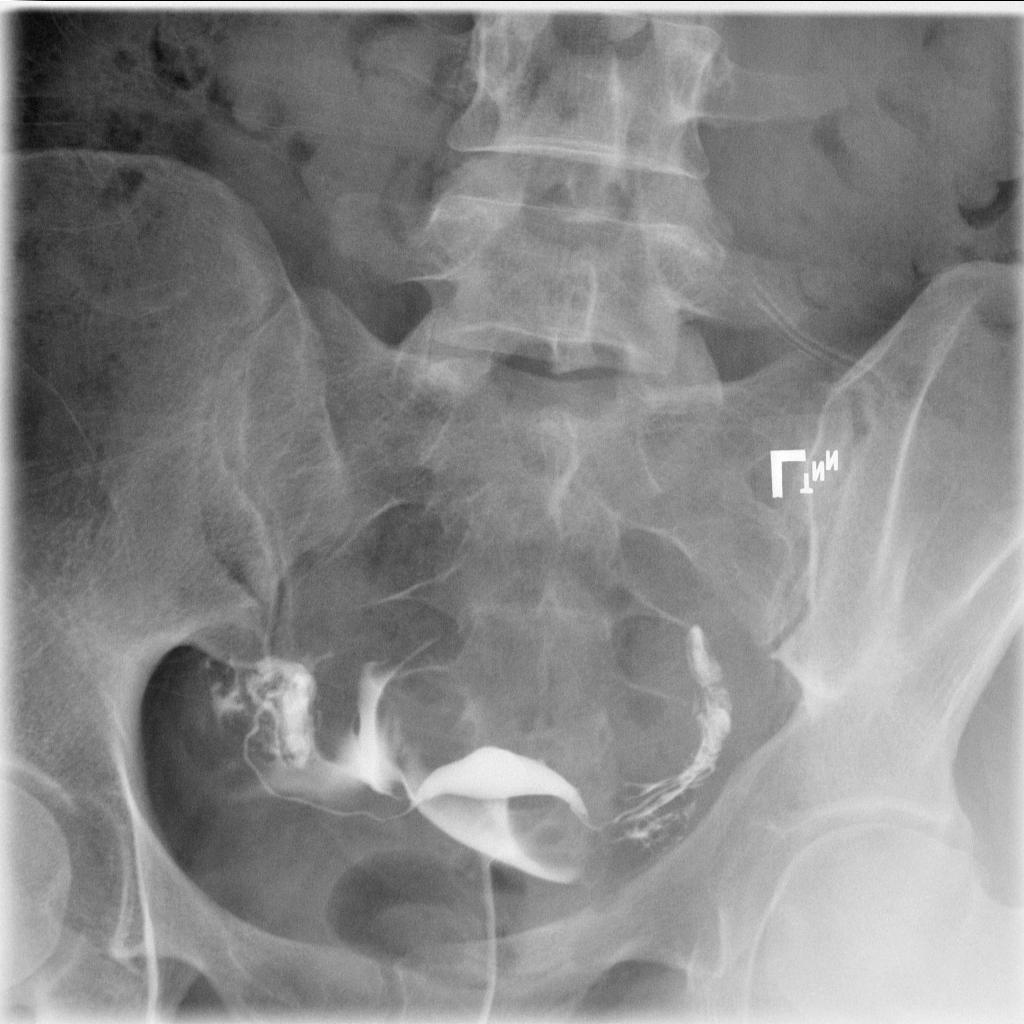

[4 of 4 positions shown; findings below may reference images not displayed]

FLUOROSCOPY TIME:  Radiation Exposure Index (as provided by the
fluoroscopic device): No prior.

If the device does not provide the exposure index:

Fluoroscopy Time:  0 minutes 36 seconds

Number of Acquired Images:  4
FINDINGS: No focal uterine abnormality identified. Fallopian tubes appear
normal. Bilateral free spill noted.
IMPRESSION: No focal abnormalities identified. Fallopian tubes appear widely
patent.

## 2019-10-30 ENCOUNTER — Ambulatory Visit (INDEPENDENT_AMBULATORY_CARE_PROVIDER_SITE_OTHER): Payer: BLUE CROSS/BLUE SHIELD | Admitting: Family Medicine

## 2019-10-30 DIAGNOSIS — Z5329 Procedure and treatment not carried out because of patient's decision for other reasons: Secondary | ICD-10-CM

## 2019-11-22 ENCOUNTER — Ambulatory Visit (INDEPENDENT_AMBULATORY_CARE_PROVIDER_SITE_OTHER): Payer: BC Managed Care – PPO | Admitting: Family Medicine

## 2019-11-22 ENCOUNTER — Encounter: Payer: Self-pay | Admitting: Family Medicine

## 2019-11-22 ENCOUNTER — Other Ambulatory Visit: Payer: Self-pay

## 2019-11-22 VITALS — BP 128/72 | HR 87 | Temp 97.5°F | Ht 64.0 in | Wt 161.0 lb

## 2019-11-22 DIAGNOSIS — B373 Candidiasis of vulva and vagina: Secondary | ICD-10-CM | POA: Diagnosis not present

## 2019-11-22 DIAGNOSIS — Z23 Encounter for immunization: Secondary | ICD-10-CM | POA: Diagnosis not present

## 2019-11-22 DIAGNOSIS — Z13220 Encounter for screening for lipoid disorders: Secondary | ICD-10-CM

## 2019-11-22 DIAGNOSIS — B3731 Acute candidiasis of vulva and vagina: Secondary | ICD-10-CM | POA: Insufficient documentation

## 2019-11-22 DIAGNOSIS — Z0000 Encounter for general adult medical examination without abnormal findings: Secondary | ICD-10-CM | POA: Diagnosis not present

## 2019-11-22 DIAGNOSIS — Z124 Encounter for screening for malignant neoplasm of cervix: Secondary | ICD-10-CM | POA: Insufficient documentation

## 2019-11-22 HISTORY — DX: Acute candidiasis of vulva and vagina: B37.31

## 2019-11-22 MED ORDER — FLUCONAZOLE 150 MG PO TABS: 150.0000 mg | ORAL_TABLET | Freq: Once | ORAL | 0 refills | Status: AC

## 2019-11-22 NOTE — Progress Notes (Signed)
Patient ID: LYNSEY ANGE, female    DOB: 1990/11/14, 29 y.o.   MRN: [id number]   Chief Complaint  Patient presents with  . Annual Exam   Subjective:  CC: wellness exam  Presents for a wellness exam today.  Her only concern is itching in the perineal area which has occurred since she delivered her child in November 2020.  She has tried over-the-counter yeast medication without results.  GYN assessed this in 2020,  said was negative for yeast and negative for BV.  Her son is almost 79-year-old, she is still breast-feeding, working part-time as a Armed forces operational officer,  enjoying motherhood.    Medical History Sani has a past medical history of Herpes simplex virus (HSV) infection, Medical history non-contributory, and Trichotillomania.   Outpatient Encounter Medications as of 11/22/2019  Medication Sig  . fluconazole (DIFLUCAN) 150 MG tablet Take 1 tablet (150 mg total) by mouth once for 1 dose.  . [DISCONTINUED] ferrous sulfate 325 (65 FE) MG tablet Take 1 tablet (325 mg total) by mouth daily with breakfast. Take with Vitamin C  . [DISCONTINUED] gabapentin (NEURONTIN) 800 MG tablet Take 1 tablet (800 mg total) by mouth at bedtime for 14 days. Take nightly for 3 days, then up to 14 days as needed  . [DISCONTINUED] oxycodone (OXY-IR) 5 MG capsule Take 1 capsule (5 mg total) by mouth every 6 (six) hours as needed for pain.   No facility-administered encounter medications on file as of 11/22/2019.     Review of Systems  Constitutional: Negative for chills and fever.  Respiratory: Negative for shortness of breath.   Cardiovascular: Negative for chest pain.  Gastrointestinal: Negative for abdominal pain.  Genitourinary: Negative for dysuria, flank pain, frequency, hematuria, pelvic pain, urgency, vaginal bleeding, vaginal discharge and vaginal pain.       Itching since around the time of delivery of her baby in November 2020.     Vitals BP 128/72   Pulse 87   Temp (!) 97.5 F (36.4 C)    Ht 5\' 4"  (1.626 m)   Wt 161 lb (73 kg)   SpO2 99%   BMI 27.64 kg/m   Objective:   Physical Exam Vitals and nursing note reviewed. Exam conducted with a chaperone present.  Constitutional:      Appearance: Normal appearance.  HENT:     Head: Normocephalic.     Right Ear: Tympanic membrane normal.     Left Ear: Tympanic membrane normal.     Nose: Nose normal.     Mouth/Throat:     Mouth: Mucous membranes are moist.     Pharynx: Oropharynx is clear.  Eyes:     Extraocular Movements: Extraocular movements intact.  Cardiovascular:     Rate and Rhythm: Normal rate and regular rhythm.     Heart sounds: Normal heart sounds.  Pulmonary:     Effort: Pulmonary effort is normal.     Breath sounds: Normal breath sounds.  Chest:     Chest wall: No mass, deformity or tenderness.     Breasts:        Right: No mass or skin change.        Left: No mass or skin change.     Comments: Lactating/ fullness noted left breast more than right.   Abdominal:     General: Bowel sounds are normal.     Palpations: Abdomen is soft.     Tenderness: There is abdominal tenderness in the right lower quadrant. There is no  guarding.  Lymphadenopathy:     Cervical: No cervical adenopathy.  Skin:    General: Skin is warm and dry.  Neurological:     General: No focal deficit present.     Mental Status: She is alert and oriented to person, place, and time.  Psychiatric:        Mood and Affect: Mood normal.        Behavior: Behavior normal.        Thought Content: Thought content normal.        Judgment: Judgment normal.     Comments: Denies depression since birth of child. Happy, still breast feeding, works part-time Armed forces operational officer.    External GU: no rashes or lesions. Vagina: thick, white discharge; tissue pink in color. Cervix: normal appearance. No CMT. Bimanual exam: no tenderness or obvious masses.Difficulty locating cervix, needed large speculum.      Assessment and Plan   1. Wellness  examination - IGP, Aptima HPV - CBC with Differential - Comprehensive Metabolic Panel (CMET) - Lipid Profile  2. Need for vaccination - Flu Vaccine QUAD 6+ mos PF IM (Fluarix Quad PF)  3. Screening for cervical cancer - IGP, Aptima HPV  4. Yeast infection of the vagina - fluconazole (DIFLUCAN) 150 MG tablet; Take 1 tablet (150 mg total) by mouth once for 1 dose.  Dispense: 1 tablet; Refill: 0  5. Screening for lipid disorders - CBC with Differential - Comprehensive Metabolic Panel (CMET) - Lipid Profile   Healthy and normal exam.  She has no chronic conditions to manage today.  Her only concern is the itching in the perineum area.  There was thick white discharge noted upon pelvic exam.  Discussion around one-time dose of Diflucan, and breast-feeding.  She will consider whether or not she wants to take the one-time dose of Diflucan, information read concerning infants and Diflucan, the greatest risk of harm is during pregnancy.  This is not a daily medication, a one-time dose.  Agrees with plan of care discussed today. Understands warning signs to seek further care: Any significant changes in health status. Understands to follow-up if if needed, 1 year for her annual exam.  She has not had routine labs in a while, she will get CBC, CMP, and lipid profile while fasting.  We will notify her results once available.  She will continue with a healthy lifestyle, and exercise as she is already doing.   Novella Olive, NP 11/22/2019   The patient comes in today for a wellness visit.  A review of their health history was completed.  A review of medications was also completed.  Any needed refills; none  Eating habits: eats healthy   Falls/  MVA accidents in past few months: none  Regular exercise: walks 1-3 times a week 1-3 miles   Specialist pt sees on regular basis: none  Preventative health issues were discussed.   Additional concerns: itching in private area. Pt gave birth  (via C-Section) in November and thought it would go away after giving birth but has gradually worsened. Intercourse makes itching worse

## 2019-11-22 NOTE — Patient Instructions (Signed)
Preventive Care 20-30 Years Old, Female Preventive care refers to visits with your health care provider and lifestyle choices that can promote health and wellness. This includes:  A yearly physical exam. This may also be called an annual well check.  Regular dental visits and eye exams.  Immunizations.  Screening for certain conditions.  Healthy lifestyle choices, such as eating a healthy diet, getting regular exercise, not using drugs or products that contain nicotine and tobacco, and limiting alcohol use. What can I expect for my preventive care visit? Physical exam Your health care provider will check your:  Height and weight. This may be used to calculate body mass index (BMI), which tells if you are at a healthy weight.  Heart rate and blood pressure.  Skin for abnormal spots. Counseling Your health care provider may ask you questions about your:  Alcohol, tobacco, and drug use.  Emotional well-being.  Home and relationship well-being.  Sexual activity.  Eating habits.  Work and work Statistician.  Method of birth control.  Menstrual cycle.  Pregnancy history. What immunizations do I need?  Influenza (flu) vaccine  This is recommended every year. Tetanus, diphtheria, and pertussis (Tdap) vaccine  You may need a Td booster every 10 years. Varicella (chickenpox) vaccine  You may need this if you have not been vaccinated. Human papillomavirus (HPV) vaccine  If recommended by your health care provider, you may need three doses over 6 months. Measles, mumps, and rubella (MMR) vaccine  You may need at least one dose of MMR. You may also need a second dose. Meningococcal conjugate (MenACWY) vaccine  One dose is recommended if you are age 75-21 years and a first-year college student living in a residence hall, or if you have one of several medical conditions. You may also need additional booster doses. Pneumococcal conjugate (PCV13) vaccine  You may need  this if you have certain conditions and were not previously vaccinated. Pneumococcal polysaccharide (PPSV23) vaccine  You may need one or two doses if you smoke cigarettes or if you have certain conditions. Hepatitis A vaccine  You may need this if you have certain conditions or if you travel or work in places where you may be exposed to hepatitis A. Hepatitis B vaccine  You may need this if you have certain conditions or if you travel or work in places where you may be exposed to hepatitis B. Haemophilus influenzae type b (Hib) vaccine  You may need this if you have certain conditions. You may receive vaccines as individual doses or as more than one vaccine together in one shot (combination vaccines). Talk with your health care provider about the risks and benefits of combination vaccines. What tests do I need?  Blood tests  Lipid and cholesterol levels. These may be checked every 5 years starting at age 33.  Hepatitis C test.  Hepatitis B test. Screening  Diabetes screening. This is done by checking your blood sugar (glucose) after you have not eaten for a while (fasting).  Sexually transmitted disease (STD) testing.  BRCA-related cancer screening. This may be done if you have a family history of breast, ovarian, tubal, or peritoneal cancers.  Pelvic exam and Pap test. This may be done every 3 years starting at age 76. Starting at age 102, this may be done every 5 years if you have a Pap test in combination with an HPV test. Talk with your health care provider about your test results, treatment options, and if necessary, the need for more tests.  Follow these instructions at home: Eating and drinking   Eat a diet that includes fresh fruits and vegetables, whole grains, lean protein, and low-fat dairy.  Take vitamin and mineral supplements as recommended by your health care provider.  Do not drink alcohol if: ? Your health care provider tells you not to drink. ? You are  pregnant, may be pregnant, or are planning to become pregnant.  If you drink alcohol: ? Limit how much you have to 0-1 drink a day. ? Be aware of how much alcohol is in your drink. In the [place] one drink equals one 12 oz bottle of beer (355 mL), one 5 oz glass of wine (148 mL), or one 1 oz glass of hard liquor (44 mL). Lifestyle  Take daily care of your teeth and gums.  Stay active. Exercise for at least 30 minutes on 5 or more days each week.  Do not use any products that contain nicotine or tobacco, such as cigarettes, e-cigarettes, and chewing tobacco. If you need help quitting, ask your health care provider.  If you are sexually active, practice safe sex. Use a condom or other form of birth control (contraception) in order to prevent pregnancy and STIs (sexually transmitted infections). If you plan to become pregnant, see your health care provider for a preconception visit. What's next?  Visit your health care provider once a year for a well check visit.  Ask your health care provider how often you should have your eyes and teeth checked.  Stay up to date on all vaccines. This information is not intended to replace advice given to you by your health care provider. Make sure you discuss any questions you have with your health care provider. Document Revised: 09/22/2017 Document Reviewed: 09/22/2017 Elsevier Patient Education  2020 [employer].

## 2019-11-23 ENCOUNTER — Encounter: Payer: Self-pay | Admitting: *Deleted

## 2019-11-23 LAB — COMPREHENSIVE METABOLIC PANEL
ALT: 18 IU/L (ref 0–32)
AST: 18 IU/L (ref 0–40)
Albumin/Globulin Ratio: 2 (ref 1.2–2.2)
Albumin: 4.8 g/dL (ref 3.9–5.0)
Alkaline Phosphatase: 78 IU/L (ref 44–121)
BUN/Creatinine Ratio: 23 (ref 9–23)
BUN: 13 mg/dL (ref 6–20)
Bilirubin Total: 0.6 mg/dL (ref 0.0–1.2)
CO2: 24 mmol/L (ref 20–29)
Calcium: 10 mg/dL (ref 8.7–10.2)
Chloride: 103 mmol/L (ref 96–106)
Creatinine, Ser: 0.57 mg/dL (ref 0.57–1.00)
GFR calc Af Amer: 144 mL/min/{1.73_m2} (ref >59)
GFR calc non Af Amer: 125 mL/min/{1.73_m2} (ref >59)
Globulin, Total: 2.4 g/dL (ref 1.5–4.5)
Glucose: 87 mg/dL (ref 65–99)
Potassium: 4.4 mmol/L (ref 3.5–5.2)
Sodium: 140 mmol/L (ref 134–144)
Total Protein: 7.2 g/dL (ref 6.0–8.5)

## 2019-11-23 LAB — CBC WITH DIFFERENTIAL/PLATELET
Basophils Absolute: 0 10*3/uL (ref 0.0–0.2)
Basos: 1 %
EOS (ABSOLUTE): 0.1 10*3/uL (ref 0.0–0.4)
Eos: 3 %
Hematocrit: 45.4 % (ref 34.0–46.6)
Hemoglobin: 15.1 g/dL (ref 11.1–15.9)
Immature Grans (Abs): 0 10*3/uL (ref 0.0–0.1)
Immature Granulocytes: 0 %
Lymphocytes Absolute: 1.7 10*3/uL (ref 0.7–3.1)
Lymphs: 33 %
MCH: 29.7 pg (ref 26.6–33.0)
MCHC: 33.3 g/dL (ref 31.5–35.7)
MCV: 89 fL (ref 79–97)
Monocytes Absolute: 0.4 10*3/uL (ref 0.1–0.9)
Monocytes: 8 %
Neutrophils Absolute: 3 10*3/uL (ref 1.4–7.0)
Neutrophils: 55 %
Platelets: 210 10*3/uL (ref 150–450)
RBC: 5.09 x10E6/uL (ref 3.77–5.28)
RDW: 11.5 % — ABNORMAL LOW (ref 11.7–15.4)
WBC: 5.3 10*3/uL (ref 3.4–10.8)

## 2019-11-23 LAB — LIPID PANEL
Chol/HDL Ratio: 3.9 ratio (ref 0.0–4.4)
Cholesterol, Total: 239 mg/dL — ABNORMAL HIGH (ref 100–199)
HDL: 62 mg/dL (ref >39)
LDL Chol Calc (NIH): 160 mg/dL — ABNORMAL HIGH (ref 0–99)
Triglycerides: 99 mg/dL (ref 0–149)
VLDL Cholesterol Cal: 17 mg/dL (ref 5–40)

## 2019-11-27 LAB — IGP, APTIMA HPV: HPV Aptima: NEGATIVE

## 2020-02-13 ENCOUNTER — Telehealth: Payer: BC Managed Care – PPO | Admitting: Family Medicine

## 2021-02-17 ENCOUNTER — Ambulatory Visit (INDEPENDENT_AMBULATORY_CARE_PROVIDER_SITE_OTHER): Payer: BC Managed Care – PPO | Admitting: Obstetrics & Gynecology

## 2021-02-17 ENCOUNTER — Other Ambulatory Visit (HOSPITAL_COMMUNITY)
Admission: RE | Admit: 2021-02-17 | Discharge: 2021-02-17 | Disposition: A | Discharge status: Home / Self Care | Payer: BC Managed Care – PPO | Source: Ambulatory Visit | Attending: Obstetrics & Gynecology | Admitting: Obstetrics & Gynecology

## 2021-02-17 ENCOUNTER — Other Ambulatory Visit: Payer: Self-pay

## 2021-02-17 ENCOUNTER — Encounter: Payer: Self-pay | Admitting: Obstetrics & Gynecology

## 2021-02-17 VITALS — BP 119/80 | HR 87 | Ht 64.0 in | Wt 168.2 lb

## 2021-02-17 DIAGNOSIS — B3731 Acute candidiasis of vulva and vagina: Secondary | ICD-10-CM

## 2021-02-17 DIAGNOSIS — N760 Acute vaginitis: Secondary | ICD-10-CM | POA: Diagnosis not present

## 2021-02-17 DIAGNOSIS — Z803 Family history of malignant neoplasm of breast: Secondary | ICD-10-CM

## 2021-02-17 DIAGNOSIS — N913 Primary oligomenorrhea: Secondary | ICD-10-CM | POA: Diagnosis not present

## 2021-02-17 DIAGNOSIS — N970 Female infertility associated with anovulation: Secondary | ICD-10-CM | POA: Diagnosis not present

## 2021-02-17 MED ORDER — LETROZOLE 2.5 MG PO TABS: 2.5000 mg | ORAL_TABLET | Freq: Every day | ORAL | 1 refills | Status: AC

## 2021-02-17 MED ORDER — MEDROXYPROGESTERONE ACETATE 10 MG PO TABS: 10.0000 mg | ORAL_TABLET | Freq: Every day | ORAL | 2 refills | Status: AC

## 2021-02-17 NOTE — Progress Notes (Signed)
GYNECOLOGY OFFICE VISIT NOTE  History:   Jacqueline Mendez is a 31 y.o. 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  Was worked up at [facility].  No formal diagnosis of PCOS, but she has been treated with Metformin, Clomid and then ended up conceiving on Letrozole for her first pregnancy in 2019. Still has infrequent periods, last few cycles were 54-80+ days. Periods when they come are heavier and also more painful.  Also reports increased vulvar irritation since recent period, used new tampons.  Also wants to know about testing for hereditary breast cancer, both mother and maternal grandmother were diagnosed in their 39s.  She denies any current abnormal vaginal discharge, bleeding, pelvic pain or other concerns.    Past Medical History:  Diagnosis Date   Herpes simplex virus (HSV) infection    Trichotillomania    2011   Yeast infection of the vagina 11/22/2019    Past Surgical History:  Procedure Laterality Date   arm surgery Bilateral    broken arm x4    BREAST ENHANCEMENT SURGERY     CESAREAN SECTION N/A 12/09/2018   Procedure: CESAREAN SECTION;  Surgeon: Ouida Sills, Gwen Her, MD;  Location: [facility] ORS;  Service: Obstetrics;  Laterality: N/A;    The following portions of the patient's history were reviewed and updated as appropriate: allergies, current medications, past family history, past medical history, past social history, past surgical history and problem list.   Health Maintenance:  Normal pap on 11/22/2019.    Review of Systems:  Pertinent items noted in HPI and remainder of comprehensive ROS otherwise negative.  Physical Exam:  BP 119/80 (BP Location: Left Arm, Patient Position: Sitting, Cuff Size: Normal)    Pulse 87    Ht 5\' 4"  (1.626 m)    Wt 168 lb 3.2 oz (76.3 kg)    LMP 02/11/2021 Comment: last three days   Breastfeeding Yes    BMI 28.87 kg/m  CONSTITUTIONAL: Well-developed, well-nourished female in no acute distress.  HEENT:   Normocephalic, atraumatic. External right and left ear normal. No scleral icterus.  NECK: Normal range of motion, supple, no masses noted on observation SKIN: No rash noted. Not diaphoretic. No erythema. No pallor. MUSCULOSKELETAL: Normal range of motion. No edema noted. NEUROLOGIC: Alert and oriented to person, place, and time. Normal muscle tone coordination. No cranial nerve deficit noted. PSYCHIATRIC: Normal mood and affect. Normal behavior. Normal judgment and thought content. CARDIOVASCULAR: Normal heart rate noted RESPIRATORY: Effort and breath sounds normal, no problems with respiration noted ABDOMEN: No masses noted. No other overt distention noted.   PELVIC: Normal appearing external genitalia with mild erythema noted on inferior bilateral labia, no discrete lesions; normal urethral meatus; normal appearing vaginal mucosa and cervix.  Brown discharge noted, testing sample obtained.  Normal uterine size, no other palpable masses, no uterine or adnexal tenderness. Performed in the presence of a chaperone     Assessment and Plan:    1. Primary oligomenorrhea Possible PCOS, will check labs and manage accordingly - Estradiol - FSH/LH - TSH+Prl+TestT+TestF+17OHP - Beta hCG quant (ref lab) - Progesterone  2. Infertility associated with anovulation Recommended Letrozole as this has worked for her in the past.  Letrozole prescribed, 2.5 mg po daily on days 3 to 7 following a spontaneous menses or progestin-induced bleed. If the cycle is ovulatory but pregnancy has not occurred, the same dose should be used in the next cycle. If ovulation does not occur, the dose should be increased  to 5 mg/day cycle days 3 to 7, with a maximal dose of 7.5 mg/day.   Provera 10 mg po qd x 10 days also prescribed as needed for progestin-induced bleed. She was told to continue ovulation predictor kits and regular intercourse.  AMH level also checked today, will follow up results and manage accordingly. -  letrozole (FEMARA) 2.5 MG tablet; Take 1 tablet (2.5 mg total) by mouth daily for 5 days.  Dispense: 5 tablet; Refill: 1 - medroxyPROGESTERone (PROVERA) 10 MG tablet; Take 1 tablet (10 mg total) by mouth daily. Use for ten days  Dispense: 10 tablet; Refill: 2 - Anti mullerian hormone  3. Vulvovaginitis Likely contact dermatitis, recommended OTC hydrocortisone cream. Will follow up vaginitis testing results and manage accordingly. - Cervicovaginal ancillary only  4. FH: breast cancer in first degree relative - Was given information about [facility], testing recommended. She will find out if covered by her insurance and let us know when she is ready to proceed.   Routine preventative health maintenance measures emphasized. Please refer to After Visit Summary for other counseling recommendations.   Return for any gynecologic concerns.    I spent 30 minutes dedicated to the care of this patient including pre-visit review of records, face to face time with the patient discussing her conditions and treatments and post visit orders.    Verita Schneiders, MD, [facility], [place]

## 2021-02-18 LAB — CERVICOVAGINAL ANCILLARY ONLY
Bacterial Vaginitis (gardnerella): NEGATIVE
Candida Glabrata: NEGATIVE
Candida Vaginitis: POSITIVE — AB
Comment: NEGATIVE
Comment: NEGATIVE
Comment: NEGATIVE

## 2021-02-18 LAB — PROGESTERONE: Progesterone: 0.2 ng/mL

## 2021-02-18 MED ORDER — FLUCONAZOLE 150 MG PO TABS: 150.0000 mg | ORAL_TABLET | Freq: Once | ORAL | 3 refills | Status: AC

## 2021-02-18 NOTE — Addendum Note (Signed)
Addended by: Jaynie Collins A on: 02/18/2021 02:24 PM   Modules accepted: Orders

## 2021-02-23 LAB — ESTRADIOL: Estradiol: 36.1 pg/mL

## 2021-02-23 LAB — TSH+PRL+TESTT+TESTF+17OHP
17-Hydroxyprogesterone: 37 ng/dL
Prolactin: 20.3 ng/mL (ref 4.8–23.3)
TSH: 1.76 u[IU]/mL (ref 0.450–4.500)
Testosterone, Free: 1 pg/mL (ref 0.0–4.2)
Testosterone, Total, LC/MS: 20.8 ng/dL (ref 10.0–55.0)

## 2021-02-23 LAB — BETA HCG QUANT (REF LAB): hCG Quant: <1 m[IU]/mL

## 2021-02-23 LAB — ANTI MULLERIAN HORMONE: ANTI-MULLERIAN HORMONE (AMH): 6.99 ng/mL

## 2021-02-23 LAB — FSH/LH
FSH: 7.5 m[IU]/mL
LH: 6.6 m[IU]/mL

## 2022-01-02 ENCOUNTER — Ambulatory Visit
Admission: EM | Admit: 2022-01-02 | Discharge: 2022-01-02 | Disposition: A | Discharge status: Home / Self Care | Payer: BC Managed Care – PPO | Attending: Urgent Care | Admitting: Urgent Care

## 2022-01-02 DIAGNOSIS — Z1152 Encounter for screening for COVID-19: Secondary | ICD-10-CM | POA: Insufficient documentation

## 2022-01-02 DIAGNOSIS — R6889 Other general symptoms and signs: Secondary | ICD-10-CM | POA: Diagnosis present

## 2022-01-02 LAB — RESP PANEL BY RT-PCR (FLU A&B, COVID) ARPGX2
Influenza A by PCR: NEGATIVE
Influenza B by PCR: NEGATIVE
SARS Coronavirus 2 by RT PCR: NEGATIVE

## 2022-01-02 MED ORDER — OSELTAMIVIR PHOSPHATE 75 MG PO CAPS: 75.0000 mg | ORAL_CAPSULE | Freq: Two times a day (BID) | ORAL | 0 refills | Status: AC

## 2022-01-02 NOTE — ED Provider Notes (Signed)
Jacqueline Mendez    CSN: [id number] Arrival date & time: 01/02/22  0818      History   Chief Complaint Chief Complaint  Patient presents with   Cough   Nasal Congestion   Fever   Generalized Body Aches    HPI Jacqueline Mendez is a 31 y.o. female.    Cough Associated symptoms: fever   Fever Associated symptoms: cough     Patient presents to urgent care with flulike symptoms including cough, fever, congestion, severe body aches and chills starting yesterday.  Patient endorses sickness starting on 12/22/2021 that she believes was RSV because she was in close contact with multiple family members with positive diagnosis.  She is concerned today about COVID/flu because she was in contact with a coworker who recently went out sick with similar symptoms though did not have a positive flu diagnosis.  She states she is using alternating ibuprofen and Tylenol and it is not completely effective at calming her chills and body aches.  Past Medical History:  Diagnosis Date   Herpes simplex virus (HSV) infection    Trichotillomania    2011   Yeast infection of the vagina 11/22/2019    Patient Active Problem List   Diagnosis Date Noted   Need for vaccination 11/22/2019   Anxiety 09/10/2015   ADD (attention deficit disorder) 03/05/2013   Trichotillomania 11/15/2012   Obsessive compulsive disorder 09/07/2012    Past Surgical History:  Procedure Laterality Date   arm surgery Bilateral    broken arm x4    BREAST ENHANCEMENT SURGERY     CESAREAN SECTION N/A 12/09/2018   Procedure: CESAREAN SECTION;  Surgeon: Feliberto Gottron, Ihor Austin, MD;  Location: [facility] ORS;  Service: Obstetrics;  Laterality: N/A;    OB History     Gravida  1   Para  1   Term  1   Preterm  0   AB  0   Living  1      SAB  0   IAB  0   Ectopic  0   Multiple  0   Live Births  1            Home Medications    Prior to Admission medications   Medication Sig Start Date End Date  Taking? Authorizing Provider  medroxyPROGESTERone (PROVERA) 10 MG tablet Take 1 tablet (10 mg total) by mouth daily. Use for ten days 02/17/21   Anyanwu, Jethro Bastos, MD    Family History Family History  Problem Relation Age of Onset   Breast cancer Mother 94   Cancer Mother    Hyperlipidemia Father    Hypertension Father    Breast cancer Maternal Grandmother 92   Cancer Maternal Grandmother 60       died at 76    Alzheimer's disease Maternal Grandfather    Cancer Maternal Grandfather    Heart attack Paternal Grandfather     Social History Social History   Tobacco Use   Smoking status: Never    Passive exposure: Never   Smokeless tobacco: Never  Vaping Use   Vaping Use: Never used  Substance Use Topics   Alcohol use: Yes    Comment: Not drank during pregnancy   Drug use: No     Allergies   Codeine and Latex   Review of Systems Review of Systems  Constitutional:  Positive for fever.  Respiratory:  Positive for cough.      Physical Exam Triage Vital Signs ED Triage Vitals  Enc Vitals Group     BP 01/02/22 0839 109/65     Pulse Rate 01/02/22 0839 (!) 109     Resp 01/02/22 0839 18     Temp 01/02/22 0839 99 F (37.2 C)     Temp src --      SpO2 01/02/22 0839 96 %     Weight --      Height --      Head Circumference --      Peak Flow --      Pain Score 01/02/22 0840 5     Pain Loc --      Pain Edu? --      Excl. in GC? --    No data found.  Updated Vital Signs BP 109/65   Pulse (!) 109   Temp 99 F (37.2 C)   Resp 18   LMP 12/22/2021 (Approximate)   SpO2 96%   Breastfeeding No   Visual Acuity Right Eye Distance:   Left Eye Distance:   Bilateral Distance:    Right Eye Near:   Left Eye Near:    Bilateral Near:     Physical Exam Vitals reviewed.  Constitutional:      Appearance: Normal appearance. She is ill-appearing.  Cardiovascular:     Rate and Rhythm: Normal rate and regular rhythm.     Pulses: Normal pulses.     Heart sounds:  Normal heart sounds.  Pulmonary:     Effort: Pulmonary effort is normal.     Breath sounds: Normal breath sounds.  Skin:    General: Skin is warm and dry.  Neurological:     General: No focal deficit present.     Mental Status: She is alert and oriented to person, place, and time.  Psychiatric:        Mood and Affect: Mood normal.        Behavior: Behavior normal.      UC Treatments / Results  Labs (all labs ordered are listed, but only abnormal results are displayed) Labs Reviewed - No data to display  EKG   Radiology No results found.  Procedures Procedures (including critical care time)  Medications Ordered in UC Medications - No data to display  Initial Impression / Assessment and Plan / UC Course  I have reviewed the triage vital signs and the nursing notes.  Pertinent labs & imaging results that were available during my care of the patient were reviewed by me and considered in my medical decision making (see chart for details).   Patient is afebrile here with recent antipyretics (ibuprofen and acetaminophen). Satting well on room air, elevated HR = 109 bpm. Overall is ill appearing though nontoxic. She appears well hydrated and without respiratory distress. Pulmonary exam is unremarkable.  Lungs CTAB.  She denies sinus/maxillary tenderness.  Viral process such as flu/COVID versus secondary bacterial infection.  Respiratory pathogen results are pending.  Will treat for presumptive influenza A with Tamiflu.  If negative, reconsider bacterial infection.  Also recommending continued use of OTC medication including antipyretics for symptom control.  Final Clinical Impressions(s) / UC Diagnoses   Final diagnoses:  Flu-like symptoms     Discharge Instructions      You have been diagnosed with a viral upper respiratory infection based on your symptoms and exam. Viral illnesses cannot be treated with antibiotics - they are self limiting - and you should find your  symptoms resolving within a few days. Get plenty of rest and non-caffeinated fluids.  We have performed a respiratory swab checking for COVID, and influenza.  I have prescribed antiviral therapy for influenza based on a presumptive diagnosis. If the results of this testing are positive, someone will call you if you are eligible for any antiviral treatment.    We recommend you use over-the-counter medications for symptom control including Tylenol or ibuprofen for fever, chills or body aches, and cold/cough medication.  Saline mist spray is helpful for removing excess mucus from your nose.  Room humidifiers are helpful to ease breathing at night. You might also find relief of nasal/sinus congestion symptoms by using a nasal decongestant such as Sudafed sinus (pseudoephedrine).  You will need to obtain this medication from behind the pharmacist counter.  Speak to the pharmacist to verify that you are not duplicating medications with other over-the-counter formulations that you may be using.   Follow up here or with your primary care provider if your symptoms are worsening or not improving.        ED Prescriptions   None    PDMP not reviewed this encounter.   Charma Igo, [place] 01/02/22 [phone]

## 2022-01-02 NOTE — Discharge Instructions (Addendum)
You have been diagnosed with a viral upper respiratory infection based on your symptoms and exam. Viral illnesses cannot be treated with antibiotics - they are self limiting - and you should find your symptoms resolving within a few days. Get plenty of rest and non-caffeinated fluids.  We have performed a respiratory swab checking for COVID, and influenza.  I have prescribed antiviral therapy for influenza based on a presumptive diagnosis. If the results of this testing are positive, someone will call you if you are eligible for any antiviral treatment.    We recommend you use over-the-counter medications for symptom control including Tylenol or ibuprofen for fever, chills or body aches, and cold/cough medication.  Saline mist spray is helpful for removing excess mucus from your nose.  Room humidifiers are helpful to ease breathing at night. You might also find relief of nasal/sinus congestion symptoms by using a nasal decongestant such as Sudafed sinus (pseudoephedrine).  You will need to obtain this medication from behind the pharmacist counter.  Speak to the pharmacist to verify that you are not duplicating medications with other over-the-counter formulations that you may be using.   Follow up here or with your primary care provider if your symptoms are worsening or not improving.

## 2022-01-02 NOTE — ED Triage Notes (Signed)
Pt. Presents to UC w/ c/o a cough, fever, congestion and body aches since Nov 28th. Pt. States she had RSV on Nov 28th but is now concerned for COVID/FLU.

## 2023-02-04 ENCOUNTER — Ambulatory Visit
Admission: EM | Admit: 2023-02-04 | Discharge: 2023-02-04 | Disposition: A | Discharge status: Home / Self Care | Payer: BC Managed Care – PPO | Attending: Emergency Medicine | Admitting: Emergency Medicine

## 2023-02-04 DIAGNOSIS — J020 Streptococcal pharyngitis: Secondary | ICD-10-CM | POA: Diagnosis not present

## 2023-02-04 LAB — POCT RAPID STREP A (OFFICE): Rapid Strep A Screen: POSITIVE — AB

## 2023-02-04 MED ORDER — PENICILLIN G BENZATHINE 1200000 UNIT/2ML IM SUSY
1.2000 10*6.[IU] | PREFILLED_SYRINGE | Freq: Once | INTRAMUSCULAR | Status: AC
  Administered 2023-02-04: 1.2 10*6.[IU] via INTRAMUSCULAR

## 2023-02-04 NOTE — ED Triage Notes (Signed)
 Patient to Urgent Care with complaints of sore throat w/ exudate.   Symptoms started four days ago. Denies any other symptoms.

## 2023-02-04 NOTE — ED Provider Notes (Signed)
 CAY RALPH PELT    CSN: [id number] Arrival date & time: 02/04/23  1339      History   Chief Complaint Chief Complaint  Patient presents with   Sore Throat    HPI Jacqueline Mendez is a 33 y.o. female.  Patient presents with 4-day history of sore throat with white exudate on her tonsils.  No fever, rash, cough, shortness of breath.  Treating symptoms with ibuprofen .  The history is provided by the patient and medical records.    Past Medical History:  Diagnosis Date   Herpes simplex virus (HSV) infection    Trichotillomania    2011   Yeast infection of the vagina 11/22/2019    Patient Active Problem List   Diagnosis Date Noted   Need for vaccination 11/22/2019   Anxiety 09/10/2015   ADD (attention deficit disorder) 03/05/2013   Trichotillomania 11/15/2012   Obsessive compulsive disorder 09/07/2012    Past Surgical History:  Procedure Laterality Date   arm surgery Bilateral    broken arm x4    BREAST ENHANCEMENT SURGERY     CESAREAN SECTION N/A 12/09/2018   Procedure: CESAREAN SECTION;  Surgeon: Lovetta, Debby PARAS, MD;  Location: [facility] ORS;  Service: Obstetrics;  Laterality: N/A;    OB History     Gravida  1   Para  1   Term  1   Preterm  0   AB  0   Living  1      SAB  0   IAB  0   Ectopic  0   Multiple  0   Live Births  1            Home Medications    Prior to Admission medications   Medication Sig Start Date End Date Taking? Authorizing Provider  medroxyPROGESTERone  (PROVERA ) 10 MG tablet Take 1 tablet (10 mg total) by mouth daily. Use for ten days 02/17/21   Anyanwu, Ugonna A, MD  oseltamivir  (TAMIFLU ) 75 MG capsule Take 1 capsule (75 mg total) by mouth every 12 (twelve) hours. Patient not taking: Reported on 02/04/2023 01/02/22   Immordino, Garnette, FNP    Family History Family History  Problem Relation Age of Onset   Breast cancer Mother 40   Cancer Mother    Hyperlipidemia Father    Hypertension Father    Breast  cancer Maternal Grandmother 15   Cancer Maternal Grandmother 60       died at 12    Alzheimer's disease Maternal Grandfather    Cancer Maternal Grandfather    Heart attack Paternal Grandfather     Social History Social History   Tobacco Use   Smoking status: Never    Passive exposure: Never   Smokeless tobacco: Never  Vaping Use   Vaping status: Never Used  Substance Use Topics   Alcohol use: Yes    Comment: Not drank during pregnancy   Drug use: No     Allergies   Codeine and Latex   Review of Systems Review of Systems  Constitutional:  Negative for chills and fever.  HENT:  Positive for sore throat. Negative for ear pain.   Respiratory:  Negative for cough and shortness of breath.      Physical Exam Triage Vital Signs ED Triage Vitals  Encounter Vitals Group     BP 02/04/23 1429 124/86     Systolic BP Percentile --      Diastolic BP Percentile --      Pulse Rate 02/04/23  1429 80     Resp 02/04/23 1429 18     Temp 02/04/23 1429 98 F (36.7 C)     Temp src --      SpO2 02/04/23 1429 98 %     Weight --      Height --      Head Circumference --      Peak Flow --      Pain Score 02/04/23 1422 2     Pain Loc --      Pain Education --      Exclude from Growth Chart --    No data found.  Updated Vital Signs BP 124/86   Pulse 80   Temp 98 F (36.7 C)   Resp 18   LMP 02/04/2023   SpO2 98%   Visual Acuity Right Eye Distance:   Left Eye Distance:   Bilateral Distance:    Right Eye Near:   Left Eye Near:    Bilateral Near:     Physical Exam Constitutional:      General: She is not in acute distress. HENT:     Right Ear: Tympanic membrane normal.     Left Ear: Tympanic membrane normal.     Nose: Nose normal.     Mouth/Throat:     Mouth: Mucous membranes are moist.     Pharynx: Oropharyngeal exudate and posterior oropharyngeal erythema present.  Cardiovascular:     Rate and Rhythm: Normal rate and regular rhythm.     Heart sounds: Normal  heart sounds.  Pulmonary:     Effort: Pulmonary effort is normal. No respiratory distress.     Breath sounds: Normal breath sounds.  Neurological:     Mental Status: She is alert.      UC Treatments / Results  Labs (all labs ordered are listed, but only abnormal results are displayed) Labs Reviewed  POCT RAPID STREP A (OFFICE) - Abnormal; Notable for the following components:      Result Value   Rapid Strep A Screen Positive (*)    All other components within normal limits    EKG   Radiology No results found.  Procedures Procedures (including critical care time)  Medications Ordered in UC Medications  penicillin  g benzathine (BICILLIN  LA) 1200000 UNIT/2ML injection 1.2 Million Units (has no administration in time range)    Initial Impression / Assessment and Plan / UC Course  I have reviewed the triage vital signs and the nursing notes.  Pertinent labs & imaging results that were available during my care of the patient were reviewed by me and considered in my medical decision making (see chart for details).    Strep pharyngitis.  Per patient preference, treated with Bicillin  LA.  Discussed Tylenol  or ibuprofen  as needed for fever or discomfort.  Education provided on strep throat.  Instructed patient to follow up with her PCP if her symptoms are not improving.  She agrees to plan of care.    Final Clinical Impressions(s) / UC Diagnoses   Final diagnoses:  Strep pharyngitis     Discharge Instructions      You were given an injection of a long-acting penicillin  to treat strep throat.  Additional antibiotics are not required.    Follow-up with your primary care provider if you are not improving.     ED Prescriptions   None    PDMP not reviewed this encounter.   Corlis Burnard DEL, NP 02/04/23 [phone]

## 2023-02-04 NOTE — Discharge Instructions (Addendum)
 You were given an injection of a long-acting penicillin to treat strep throat.  Additional antibiotics are not required.    Follow-up with your primary care provider if you are not improving.

## 2023-06-02 ENCOUNTER — Other Ambulatory Visit: Payer: Self-pay

## 2023-06-02 DIAGNOSIS — R1084 Generalized abdominal pain: Secondary | ICD-10-CM | POA: Insufficient documentation

## 2023-06-02 DIAGNOSIS — R1030 Lower abdominal pain, unspecified: Secondary | ICD-10-CM | POA: Diagnosis present

## 2023-06-02 LAB — CBC
HCT: 40 % (ref 36.0–46.0)
Hemoglobin: 13.7 g/dL (ref 12.0–15.0)
MCH: 31.1 pg (ref 26.0–34.0)
MCHC: 34.3 g/dL (ref 30.0–36.0)
MCV: 90.9 fL (ref 80.0–100.0)
Platelets: 166 10*3/uL (ref 150–400)
RBC: 4.4 MIL/uL (ref 3.87–5.11)
RDW: 11.9 % (ref 11.5–15.5)
WBC: 5.9 10*3/uL (ref 4.0–10.5)
nRBC: 0 % (ref 0.0–0.2)

## 2023-06-02 LAB — URINALYSIS, ROUTINE W REFLEX MICROSCOPIC
Bilirubin Urine: NEGATIVE
Glucose, UA: NEGATIVE mg/dL
Hgb urine dipstick: NEGATIVE
Ketones, ur: NEGATIVE mg/dL
Leukocytes,Ua: NEGATIVE
Nitrite: NEGATIVE
Protein, ur: NEGATIVE mg/dL
Specific Gravity, Urine: 1.02 (ref 1.005–1.030)
pH: 6 (ref 5.0–8.0)

## 2023-06-02 LAB — COMPREHENSIVE METABOLIC PANEL WITH GFR
ALT: 29 U/L (ref 0–44)
AST: 21 U/L (ref 15–41)
Albumin: 4.4 g/dL (ref 3.5–5.0)
Alkaline Phosphatase: 39 U/L (ref 38–126)
Anion gap: 9 (ref 5–15)
BUN: 21 mg/dL — ABNORMAL HIGH (ref 6–20)
CO2: 25 mmol/L (ref 22–32)
Calcium: 9 mg/dL (ref 8.9–10.3)
Chloride: 103 mmol/L (ref 98–111)
Creatinine, Ser: 0.77 mg/dL (ref 0.44–1.00)
GFR, Estimated: >60 mL/min (ref >60)
Glucose, Bld: 151 mg/dL — ABNORMAL HIGH (ref 70–99)
Potassium: 3.4 mmol/L — ABNORMAL LOW (ref 3.5–5.1)
Sodium: 137 mmol/L (ref 135–145)
Total Bilirubin: 0.8 mg/dL (ref 0.0–1.2)
Total Protein: 7.3 g/dL (ref 6.5–8.1)

## 2023-06-02 LAB — POC URINE PREG, ED: Preg Test, Ur: NEGATIVE

## 2023-06-02 LAB — LIPASE, BLOOD: Lipase: 46 U/L (ref 11–51)

## 2023-06-02 NOTE — ED Triage Notes (Signed)
 Pt arrives with EMS with c/o "felt like something pop in my uterus area" and began to have pain shooting into her abdomen associated with chills, nausea, shortness of breath and palpitations. Her abdomen is distended. Onset tonight around 2230.   BP- 156/100, HR-125, 100% RA

## 2023-06-03 ENCOUNTER — Emergency Department

## 2023-06-03 ENCOUNTER — Emergency Department
Admission: EM | Admit: 2023-06-03 | Discharge: 2023-06-03 | Disposition: A | Discharge status: Home / Self Care | Attending: Emergency Medicine | Admitting: Emergency Medicine

## 2023-06-03 DIAGNOSIS — R1084 Generalized abdominal pain: Secondary | ICD-10-CM

## 2023-06-03 MED ORDER — KETOROLAC TROMETHAMINE 30 MG/ML IJ SOLN
30.0000 mg | Freq: Once | INTRAMUSCULAR | Status: AC
  Administered 2023-06-03: 30 mg via INTRAVENOUS
  Filled 2023-06-03: qty 1

## 2023-06-03 MED ORDER — ONDANSETRON HCL 4 MG/2ML IJ SOLN
4.0000 mg | Freq: Once | INTRAMUSCULAR | Status: AC
  Administered 2023-06-03: 4 mg via INTRAVENOUS
  Filled 2023-06-03: qty 2

## 2023-06-03 MED ORDER — ONDANSETRON 4 MG PO TBDP: 4.0000 mg | ORAL_TABLET | Freq: Four times a day (QID) | ORAL | 0 refills | Status: AC | PRN

## 2023-06-03 MED ORDER — SODIUM CHLORIDE 0.9 % IV BOLUS (SEPSIS)
1000.0000 mL | Freq: Once | INTRAVENOUS | Status: AC
  Administered 2023-06-03: 1000 mL via INTRAVENOUS

## 2023-06-03 MED ORDER — IOHEXOL 300 MG/ML  SOLN
100.0000 mL | Freq: Once | INTRAMUSCULAR | Status: AC | PRN
  Administered 2023-06-03: 100 mL via INTRAVENOUS

## 2023-06-03 NOTE — ED Notes (Signed)
 EDP notified of BP, Pt states she runs between 100-120 systolically

## 2023-06-03 NOTE — ED Notes (Signed)
 [place] at bedside

## 2023-06-03 NOTE — ED Notes (Signed)
 Pt to CT

## 2023-06-03 NOTE — Discharge Instructions (Addendum)
You may alternate Tylenol 1000 mg every 6 hours as needed for pain, fever and Ibuprofen 800 mg every 6-8 hours as needed for pain, fever.  Please take Ibuprofen with food.  Do not take more than 4000 mg of Tylenol (acetaminophen) in a 24 hour period. ° °

## 2023-06-03 NOTE — ED Provider Notes (Signed)
 [facility] Provider Note    Event Date/Time   First MD Initiated Contact with Patient 06/03/23 0340     (approximate)   History   Abdominal Pain   HPI  Jacqueline Mendez is a 33 y.o. female with history of prior C-section who presents to the emergency department complaints of sudden onset lower abdominal pain that felt like a "pop" while lying in bed.  States she became very nauseated.  States her pain was very severe but has improved but is now moved to the right lower and upper quadrants.  She felt like she felt something pulsing in the right upper abdomen and feels like something is moving in her abdomen.  She denies vomiting, diarrhea, dysuria or hematuria, vaginal bleeding or discharge.  Patient does state that she is trying to get pregnant.  Her last menstrual cycle was 04/28/2023 but she states she does have irregular cycles.  She is not sure if she has ever had a prior ovarian cyst.   History provided by patient, husband.    Past Medical History:  Diagnosis Date   Herpes simplex virus (HSV) infection    Trichotillomania    2011   Yeast infection of the vagina 11/22/2019    Past Surgical History:  Procedure Laterality Date   arm surgery Bilateral    broken arm x4    BREAST ENHANCEMENT SURGERY     CESAREAN SECTION N/A 12/09/2018   Procedure: CESAREAN SECTION;  Surgeon: Madelene Schanz, Joselyn Nicely, MD;  Location: [facility] ORS;  Service: Obstetrics;  Laterality: N/A;    MEDICATIONS:  Prior to Admission medications   Medication Sig Start Date End Date Taking? Authorizing Provider  medroxyPROGESTERone  (PROVERA ) 10 MG tablet Take 1 tablet (10 mg total) by mouth daily. Use for ten days 02/17/21   Anyanwu, Ugonna A, MD  oseltamivir  (TAMIFLU ) 75 MG capsule Take 1 capsule (75 mg total) by mouth every 12 (twelve) hours. Patient not taking: Reported on 02/04/2023 01/02/22   Acie Acosta, FNP    Physical Exam   Triage Vital Signs: ED Triage Vitals   Encounter Vitals Group     BP 06/02/23 2258 (!) 146/92     Systolic BP Percentile --      Diastolic BP Percentile --      Pulse Rate 06/02/23 2258 (!) 124     Resp 06/02/23 2258 18     Temp 06/02/23 2258 98.4 F (36.9 C)     Temp Source 06/02/23 2258 Oral     SpO2 06/02/23 2258 100 %     Weight 06/02/23 2257 160 lb (72.6 kg)     Height 06/02/23 2257 5' 4.5" (1.638 m)     Head Circumference --      Peak Flow --      Pain Score 06/02/23 2257 8     Pain Loc --      Pain Education --      Exclude from Growth Chart --     Most recent vital signs: Vitals:   06/03/23 0634 06/03/23 0700  BP: 101/61 96/63  Pulse: 73 65  Resp:    Temp:    SpO2: 97% 96%    CONSTITUTIONAL: Alert, responds appropriately to questions. Well-appearing; well-nourished HEAD: Normocephalic, atraumatic EYES: Conjunctivae clear, pupils appear equal, sclera nonicteric ENT: normal nose; moist mucous membranes NECK: Supple, normal ROM CARD: RRR; S1 and S2 appreciated RESP: Normal chest excursion without splinting or tachypnea; breath sounds clear and equal bilaterally; no wheezes, no rhonchi,  no rales, no hypoxia or respiratory distress, speaking full sentences ABD/GI: Non-distended; soft, tender throughout the abdomen worse in the suprapubic area, no guarding or rebound, negative Murphy sign BACK: The back appears normal EXT: Normal ROM in all joints; no deformity noted, no edema SKIN: Normal color for age and race; warm; no rash on exposed skin NEURO: Moves all extremities equally, normal speech PSYCH: The patient's mood and manner are appropriate.   ED Results / Procedures / Treatments   LABS: (all labs ordered are listed, but only abnormal results are displayed) Labs Reviewed  COMPREHENSIVE METABOLIC PANEL WITH GFR - Abnormal; Notable for the following components:      Result Value   Potassium 3.4 (*)    Glucose, Bld 151 (*)    BUN 21 (*)    All other components within normal limits  URINALYSIS,  ROUTINE W REFLEX MICROSCOPIC - Abnormal; Notable for the following components:   Color, Urine YELLOW (*)    APPearance HAZY (*)    All other components within normal limits  LIPASE, BLOOD  CBC  POC URINE PREG, ED     EKG:  EKG Interpretation Date/Time:  Thursday Jun 02 2023 23:04:25 EDT Ventricular Rate:  125 PR Interval:  168 QRS Duration:  80 QT Interval:  302 QTC Calculation: 435 R Axis:   96  Text Interpretation: Sinus tachycardia Rightward axis Borderline ECG No previous ECGs available Confirmed by Verneda Golder [phone]) on 06/03/2023 4:03:37 AM         RADIOLOGY: My personal review and interpretation of imaging: CT of the abdomen pelvis shows no acute abnormality.  Ultrasound shows normal blood flow to both ovaries.  I have personally reviewed all radiology reports.   CT ABDOMEN PELVIS W CONTRAST Result Date: 06/03/2023 CLINICAL DATA:  Acute, nonlocalized abdominal pain EXAM: CT ABDOMEN AND PELVIS WITH CONTRAST TECHNIQUE: Multidetector CT imaging of the abdomen and pelvis was performed using the standard protocol following bolus administration of intravenous contrast. RADIATION DOSE REDUCTION: This exam was performed according to the departmental dose-optimization program which includes automated exposure control, adjustment of the mA and/or kV according to patient size and/or use of iterative reconstruction technique. CONTRAST:  100mL OMNIPAQUE IOHEXOL 300 MG/ML  SOLN COMPARISON:  None Available. FINDINGS: Lower chest: Partially covered breast implants are symmetrically inflated. Hepatobiliary: No focal liver abnormality.No evidence of biliary obstruction or stone. Pancreas: Unremarkable. Spleen: Unremarkable. Adrenals/Urinary Tract: Negative adrenals. No hydronephrosis or stone. Unremarkable bladder. Stomach/Bowel:  No obstruction. No appendicitis. Vascular/Lymphatic: No acute vascular abnormality. No mass or adenopathy. Reproductive:No pathologic findings. Other: No ascites or  pneumoperitoneum. Musculoskeletal: No acute abnormalities. IMPRESSION: No acute finding.  Negative for appendicitis. Electronically Signed   By: Ronnette Coke M.D.   On: 06/03/2023 06:46   US  PELVIC COMPLETE W TRANSVAGINAL AND TORSION R/O Result Date: 06/03/2023 CLINICAL DATA:  Pelvic pain EXAM: TRANSABDOMINAL AND TRANSVAGINAL ULTRASOUND OF PELVIS DOPPLER ULTRASOUND OF OVARIES TECHNIQUE: Both transabdominal and transvaginal ultrasound examinations of the pelvis were performed. Transabdominal technique was performed for global imaging of the pelvis including uterus, ovaries, adnexal regions, and pelvic cul-de-sac. It was necessary to proceed with endovaginal exam following the transabdominal exam to visualize the endometrium. Color and duplex Doppler ultrasound was utilized to evaluate blood flow to the ovaries. COMPARISON:  None Available. FINDINGS: Uterus Measurements: 8 x 4.4 x 5.8 cm = volume: 108 mL. No fibroids or other mass visualized. Endometrium Thickness: 6 mm.  No focal abnormality visualized. Right ovary Measurements: 35 x 24 x 31 mm =  volume: 13 mL. Normal appearance/no adnexal mass. Left ovary Measurements: 43 x 24 x 22 mm = volume: 12 mL. Normal appearance/no adnexal mass. Pulsed Doppler evaluation of both ovaries demonstrates normal low-resistance arterial and venous waveforms. Other findings No abnormal free fluid. IMPRESSION: No acute finding.  Normal ovarian blood flow. Electronically Signed   By: Ronnette Coke M.D.   On: 06/03/2023 05:54     PROCEDURES:  Critical Care performed: No   Procedures    IMPRESSION / MDM / ASSESSMENT AND PLAN / ED COURSE  I reviewed the triage vital signs and the nursing notes.    Patient here with sudden onset pelvic pain that real radiates throughout the abdomen.  The patient is on the cardiac monitor to evaluate for evidence of arrhythmia and/or significant heart rate changes.   DIFFERENTIAL DIAGNOSIS (includes but not limited to):    Ruptured ovarian cyst, ovarian torsion, ectopic pregnancy, UTI, kidney stone, pyelonephritis, appendicitis, doubt perforation given nonsurgical abdomen   Patient's presentation is most consistent with acute presentation with potential threat to life or bodily function.   PLAN: Will obtain transvaginal ultrasound with Doppler.  Workup initiated from triage shows normal white blood cell count, electrolytes, LFTs, lipase.  Pregnancy test negative.  Urine shows no sign of infection.  Will give IV fluids, pain and nausea medicine.  Will keep NPO.   MEDICATIONS GIVEN IN ED: Medications  sodium chloride  0.9 % bolus 1,000 mL (0 mLs Intravenous Stopped 06/03/23 0503)  ondansetron  (ZOFRAN ) injection 4 mg (4 mg Intravenous Given 06/03/23 0435)  ketorolac  (TORADOL ) 30 MG/ML injection 30 mg (30 mg Intravenous Given 06/03/23 0435)  iohexol (OMNIPAQUE) 300 MG/ML solution 100 mL (100 mLs Intravenous Contrast Given 06/03/23 1610)     ED COURSE: Ultrasound reviewed and interpreted by myself and the radiologist and shows no acute abnormality.  Normal blood flow to both ovaries.  Will proceed with CT of the abdomen pelvis.   CT scan reviewed and interpreted by myself and the radiologist and shows no acute abnormality.  No free air or free fluid within the abdomen or pelvis.  Appendix is normal.  Normal-appearing gallbladder.  Given reassuring labs, urine, imaging today and that patient's abdominal exam is benign she is well-appearing, nontoxic, I feel she is safe for discharge.  Recommended Tylenol , Motrin , increase fluid intake.  Discussed return precautions with patient and husband.  They are comfortable with this plan.   At this time, I do not feel there is any life-threatening condition present. I reviewed all nursing notes, vitals, pertinent previous records.  All lab and urine results, EKGs, imaging ordered have been independently reviewed and interpreted by myself.  I reviewed all available radiology reports from  any imaging ordered this visit.  Based on my assessment, I feel the patient is safe to be discharged home without further emergent workup and can continue workup as an outpatient as needed. Discussed all findings, treatment plan as well as usual and customary return precautions.  They verbalize understanding and are comfortable with this plan.  Outpatient follow-up has been provided as needed.  All questions have been answered.   CONSULTS:  none   OUTSIDE RECORDS REVIEWED: Reviewed last OB/GYN note in February 2014.       FINAL CLINICAL IMPRESSION(S) / ED DIAGNOSES   Final diagnoses:  Generalized abdominal pain     Rx / DC Orders   ED Discharge Orders          Ordered    ondansetron  (ZOFRAN -ODT) 4 MG  disintegrating tablet  Every 6 hours PRN        06/03/23 0960             Note:  This document was prepared using Dragon voice recognition software and may include unintentional dictation errors.   Geneieve Duell, Clover Dao, DO 06/03/23 [phone]
